# Patient Record
Sex: Female | Born: 2000 | Race: Black or African American | Hispanic: No | Marital: Single | Smoking: Never smoker
Health system: Southern US, Community
[De-identification: ages and names within clinical notes are randomized; demographics above are authoritative.]

## PROBLEM LIST (undated history)

## (undated) DIAGNOSIS — N189 Chronic kidney disease, unspecified: Secondary | ICD-10-CM

## (undated) DIAGNOSIS — I82409 Acute embolism and thrombosis of unspecified deep veins of unspecified lower extremity: Secondary | ICD-10-CM

## (undated) HISTORY — DX: Acute embolism and thrombosis of unspecified deep veins of unspecified lower extremity: I82.409

## (undated) HISTORY — DX: Chronic kidney disease, unspecified: N18.9

---
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
## 2020-04-03 ENCOUNTER — Other Ambulatory Visit: Payer: Self-pay | Admitting: Gastroenterology

## 2020-04-03 DIAGNOSIS — R1032 Left lower quadrant pain: Secondary | ICD-10-CM

## 2020-04-03 DIAGNOSIS — K5900 Constipation, unspecified: Secondary | ICD-10-CM

## 2020-04-18 ENCOUNTER — Other Ambulatory Visit: Payer: Medicaid Other

## 2020-04-26 ENCOUNTER — Other Ambulatory Visit: Payer: Medicaid Other

## 2020-05-01 ENCOUNTER — Ambulatory Visit
Admission: RE | Admit: 2020-05-01 | Discharge: 2020-05-01 | Disposition: A | Discharge status: Home / Self Care | Payer: 59 | Source: Ambulatory Visit | Attending: Gastroenterology | Admitting: Gastroenterology

## 2020-05-01 DIAGNOSIS — K5900 Constipation, unspecified: Secondary | ICD-10-CM

## 2020-05-01 DIAGNOSIS — R1032 Left lower quadrant pain: Secondary | ICD-10-CM

## 2020-05-01 MED ORDER — IOPAMIDOL (ISOVUE-300) INJECTION 61%
100.0000 mL | Freq: Once | INTRAVENOUS | Status: AC | PRN
  Administered 2020-05-01: 100 mL via INTRAVENOUS

## 2020-11-18 HISTORY — PX: RENAL BIOPSY: SHX156

## 2020-12-05 IMAGING — CT CT ABD-PELV W/ CM
2 of 4 series · 14 of 46 positions shown, 16 images · IV contrast (iopamidol)
Comparison: None.

CLINICAL DATA: Left lower quadrant pain and constipation for
approximately 1 year. Intermittent blood in stool.

EXAM:
CT ABDOMEN AND PELVIS WITH CONTRAST
TECHNIQUE: Multidetector CT imaging of the abdomen and pelvis was performed
using the standard protocol following bolus administration of
intravenous contrast.
CONTRAST:  100mL [id number] IOPAMIDOL (X7H7S8-066) INJECTION 61%

[Series 2: abd pelvis 5.00 br40 s3 axial · axial · 0.64mm/px · z∈[+1273,+1663]mm · 11 of 94 slices shown, 13 images]
[im 8/94  soft-tissue]
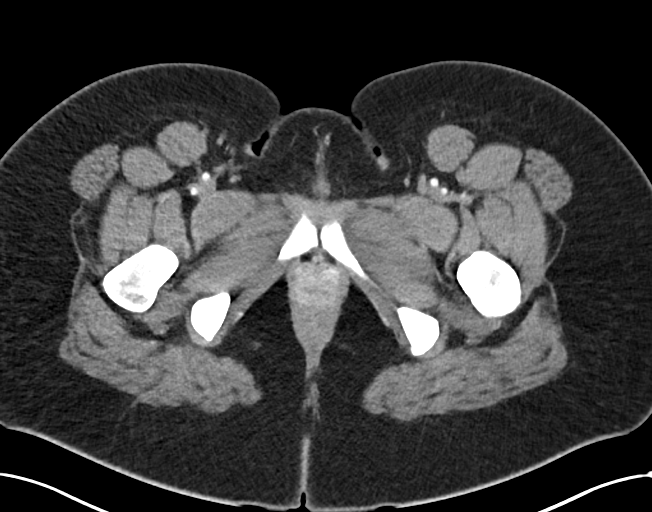
[im 8/94  bone]
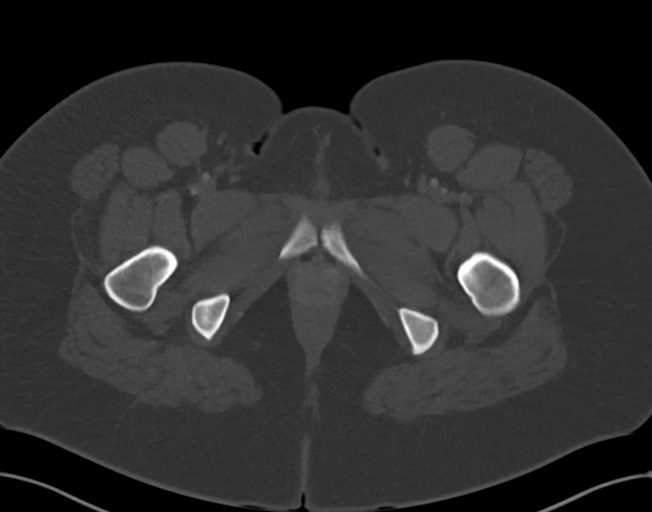
[im 16/94  soft-tissue]
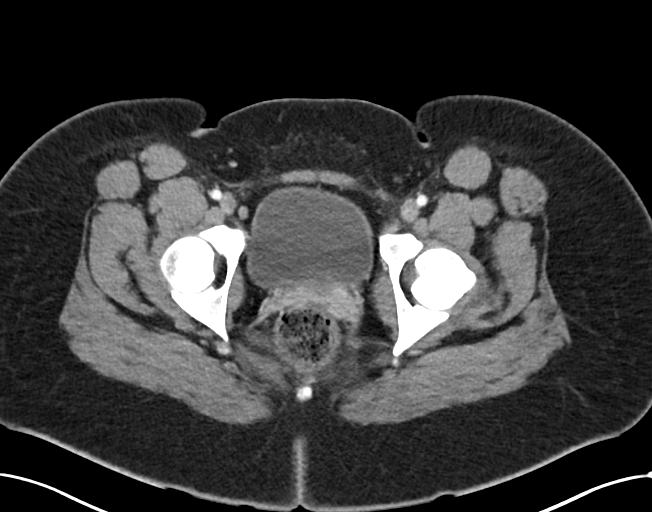
[im 24/94  soft-tissue]
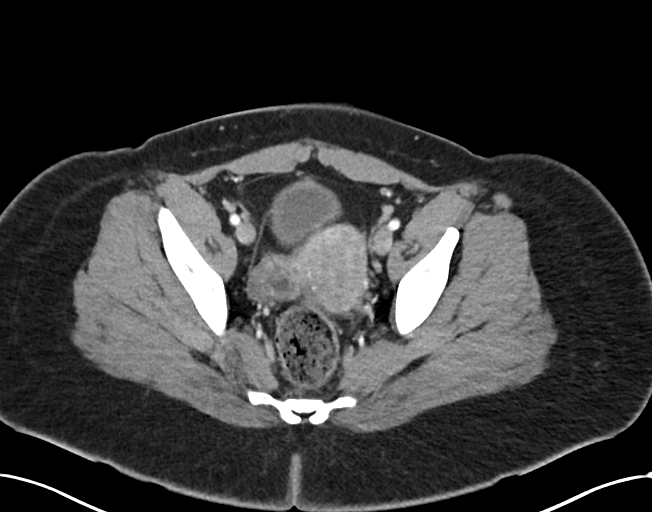
[im 32/94  soft-tissue]
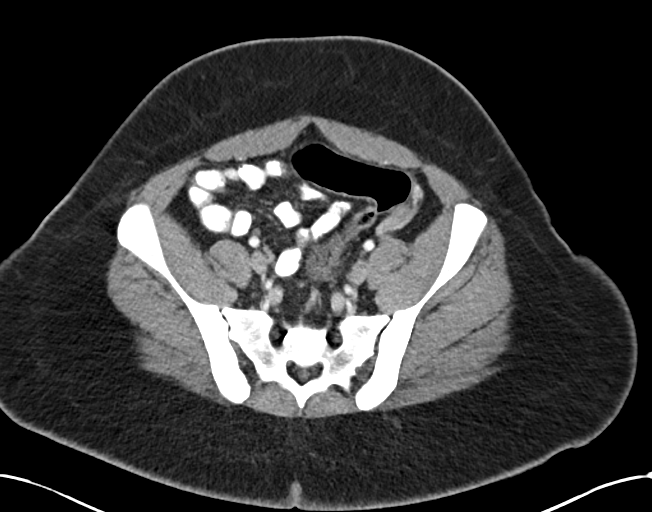
[im 39/94  soft-tissue]
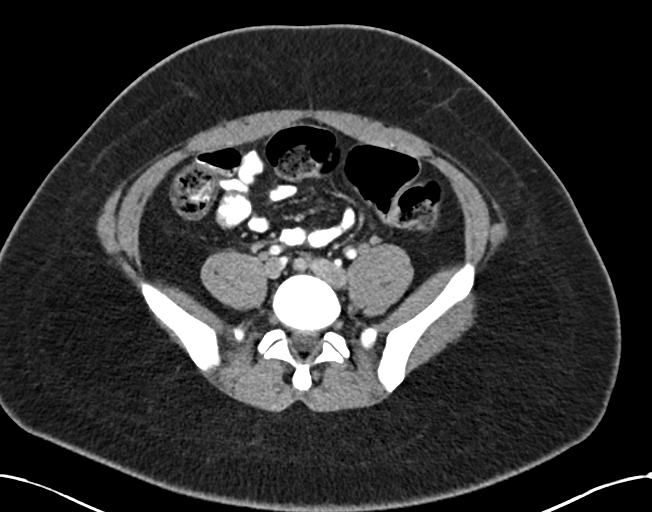
[im 47/94  soft-tissue]
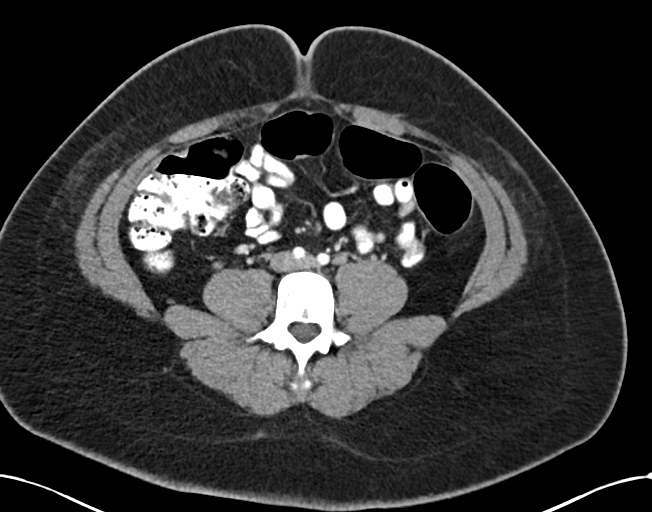
[im 55/94  soft-tissue]
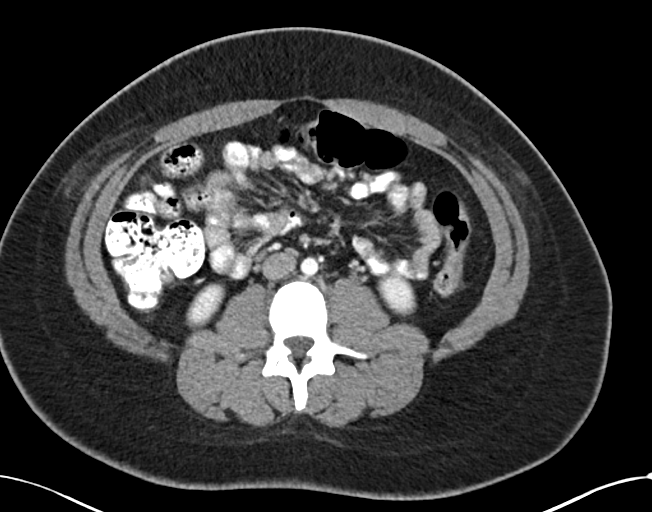
[im 63/94  soft-tissue]
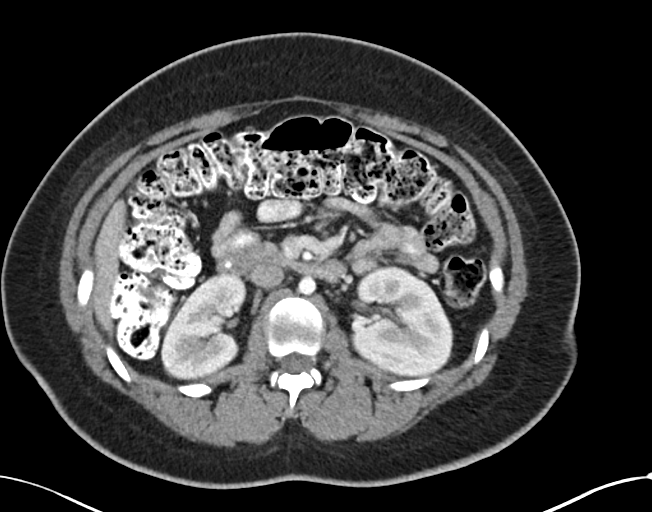
[im 70/94  soft-tissue]
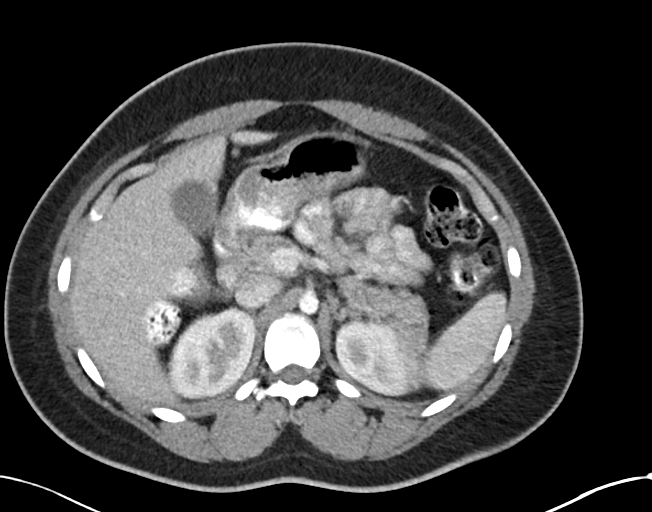
[im 70/94  bone]
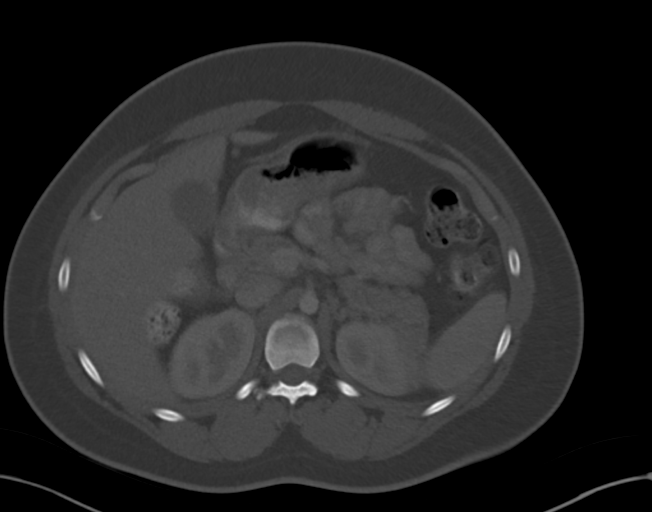
[im 78/94  soft-tissue]
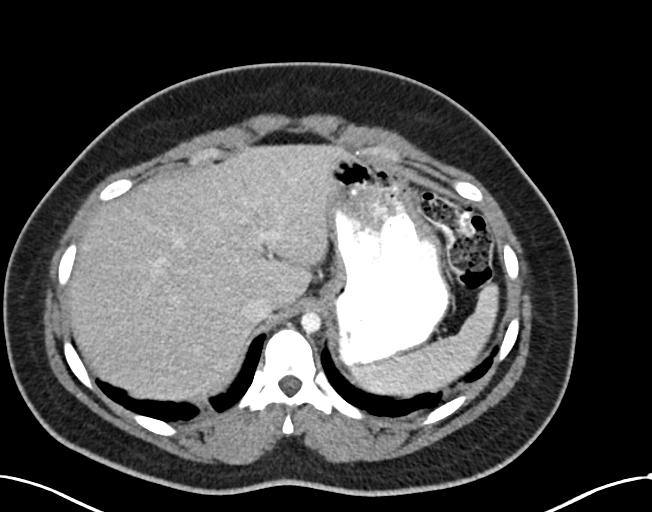
[im 86/94  soft-tissue]
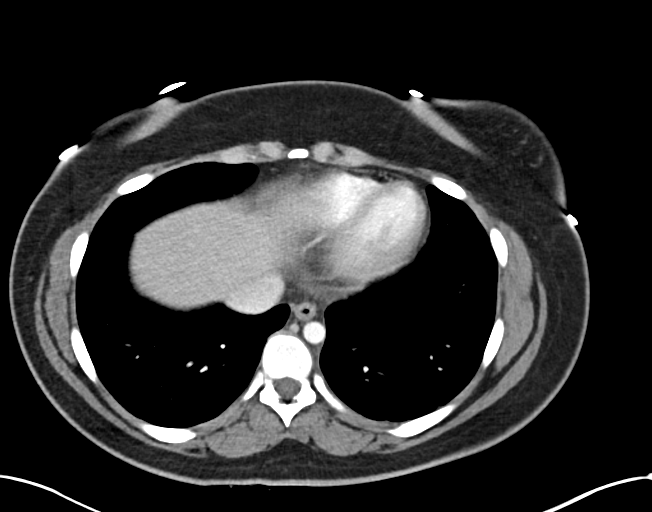

[Series 6: abd pelvis 2.00 br40 s3 cor · coronal · 0.81mm/px · 3 of 158 slices shown]
[im 53/158  soft-tissue]
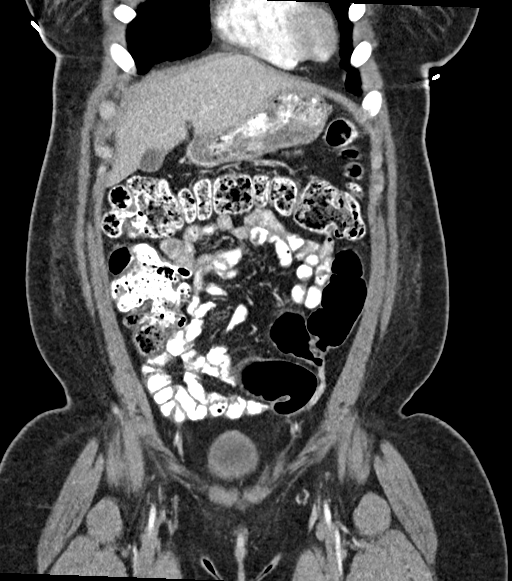
[im 70/158  soft-tissue]
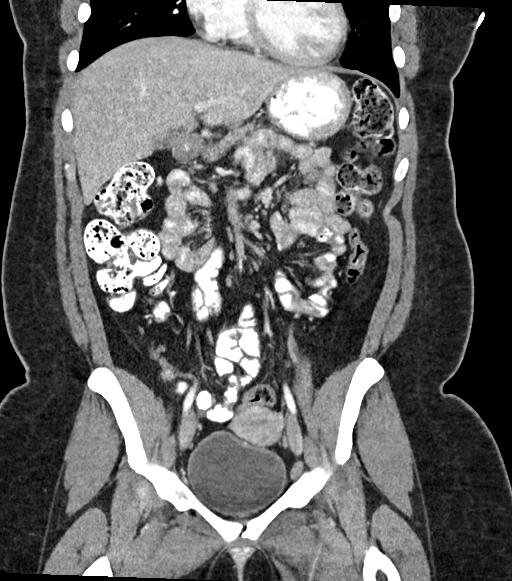
[im 88/158  soft-tissue]
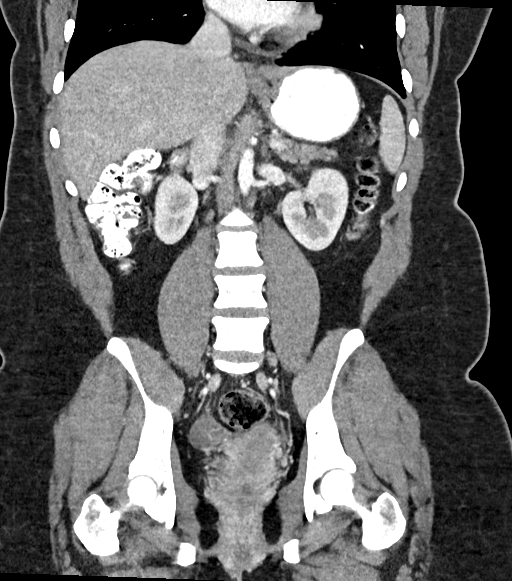

[14 of 46 positions shown; findings below may reference images not displayed]

FINDINGS: Lower Chest: No acute findings.

Hepatobiliary: No hepatic masses identified. Gallbladder is
unremarkable. No evidence of biliary ductal dilatation.

Pancreas:  No mass or inflammatory changes.

Spleen: Within normal limits in size and appearance.

Adrenals/Urinary Tract: No masses identified. No evidence of
ureteral calculi or hydronephrosis.

Stomach/Bowel: No evidence of obstruction, inflammatory process or
abnormal fluid collections. Normal appendix visualized.

Vascular/Lymphatic: No pathologically enlarged lymph nodes. No
abdominal aortic aneurysm.

Reproductive:  No mass or other significant abnormality.

Other:  None.

Musculoskeletal:  No suspicious bone lesions identified.
IMPRESSION: Negative. No acute findings or other significant abnormality.

## 2021-05-05 ENCOUNTER — Emergency Department (HOSPITAL_BASED_OUTPATIENT_CLINIC_OR_DEPARTMENT_OTHER): Payer: 59

## 2021-05-05 ENCOUNTER — Emergency Department (HOSPITAL_COMMUNITY)
Admission: EM | Admit: 2021-05-05 | Discharge: 2021-05-05 | Disposition: A | Discharge status: Home / Self Care | Payer: 59 | Attending: Emergency Medicine | Admitting: Emergency Medicine

## 2021-05-05 ENCOUNTER — Encounter (HOSPITAL_COMMUNITY): Payer: Self-pay

## 2021-05-05 ENCOUNTER — Other Ambulatory Visit: Payer: Self-pay

## 2021-05-05 DIAGNOSIS — I10 Essential (primary) hypertension: Secondary | ICD-10-CM | POA: Insufficient documentation

## 2021-05-05 DIAGNOSIS — I82431 Acute embolism and thrombosis of right popliteal vein: Secondary | ICD-10-CM | POA: Diagnosis not present

## 2021-05-05 DIAGNOSIS — M7989 Other specified soft tissue disorders: Secondary | ICD-10-CM

## 2021-05-05 DIAGNOSIS — M79661 Pain in right lower leg: Secondary | ICD-10-CM

## 2021-05-05 LAB — CBC WITH DIFFERENTIAL/PLATELET
Abs Immature Granulocytes: 0.02 10*3/uL (ref 0.00–0.07)
Basophils Absolute: 0.1 10*3/uL (ref 0.0–0.1)
Basophils Relative: 1 %
Eosinophils Absolute: 0.2 10*3/uL (ref 0.0–0.5)
Eosinophils Relative: 2 %
HCT: 41 % (ref 36.0–46.0)
Hemoglobin: 12.9 g/dL (ref 12.0–15.0)
Immature Granulocytes: 0 %
Lymphocytes Relative: 32 %
Lymphs Abs: 2.5 10*3/uL (ref 0.7–4.0)
MCH: 26.4 pg (ref 26.0–34.0)
MCHC: 31.5 g/dL (ref 30.0–36.0)
MCV: 84 fL (ref 80.0–100.0)
Monocytes Absolute: 0.5 10*3/uL (ref 0.1–1.0)
Monocytes Relative: 6 %
Neutro Abs: 4.5 10*3/uL (ref 1.7–7.7)
Neutrophils Relative %: 59 %
Platelets: 396 10*3/uL (ref 150–400)
RBC: 4.88 MIL/uL (ref 3.87–5.11)
RDW: 15.2 % (ref 11.5–15.5)
WBC: 7.7 10*3/uL (ref 4.0–10.5)
nRBC: 0 % (ref 0.0–0.2)

## 2021-05-05 LAB — BASIC METABOLIC PANEL
Anion gap: 8 (ref 5–15)
BUN: 10 mg/dL (ref 6–20)
CO2: 22 mmol/L (ref 22–32)
Calcium: 8.7 mg/dL — ABNORMAL LOW (ref 8.9–10.3)
Chloride: 110 mmol/L (ref 98–111)
Creatinine, Ser: 0.8 mg/dL (ref 0.44–1.00)
GFR, Estimated: >60 mL/min (ref >60)
Glucose, Bld: 90 mg/dL (ref 70–99)
Potassium: 4.1 mmol/L (ref 3.5–5.1)
Sodium: 140 mmol/L (ref 135–145)

## 2021-05-05 MED ORDER — OXYCODONE HCL 5 MG PO TABS
5.0000 mg | ORAL_TABLET | Freq: Four times a day (QID) | ORAL | 0 refills | Status: DC | PRN
Start: 2021-05-05 — End: 2021-12-28

## 2021-05-05 MED ORDER — APIXABAN (ELIQUIS) VTE STARTER PACK (10MG AND 5MG): ORAL_TABLET | ORAL | 0 refills | Status: DC

## 2021-05-05 NOTE — Discharge Instructions (Addendum)
Findings consistent with acute deep vein thrombosis involving the right popliteal vein, right posterior tibial veins, and right peroneal veins.

## 2021-05-05 NOTE — Progress Notes (Signed)
RLE venous duplex has been completed.  Preliminary findings given to Eustaquio Maize, [place].  Results can be found under chart review under CV PROC. 05/05/2021 12:59 PM Avey Mcmanamon RVT, RDMS

## 2021-05-05 NOTE — ED Provider Notes (Signed)
Emergency Medicine Provider Triage Evaluation Note  Molly Wise , a 20 y.o. female  was evaluated in triage.  Pt complains of gradual onset, constant, swelling/tightness in R calf for the past couple of weeks. Felt a "charlie horse" 5 days prior to the swelling in her sleep. Went to [facility] today and sent here for DVT rule out. NO chest pain or SOB. NO hx DVT/PE.   Review of Systems  Positive: + right calf swelling, pain Negative: - sob, chest pain  Physical Exam  BP (!) 149/84 (BP Location: Left Arm)   Pulse 76   Temp 98.4 F (36.9 C) (Oral)   Resp 18   SpO2 100%  Gen:   Awake, no distress   Resp:  Normal effort  MSK:   Moves extremities without difficulty  Other:  RLE swelling compared to LLE with TTP to calf. 2+ PT pulse. ROM intact.   Medical Decision Making  Medically screening exam initiated at 12:02 PM.  Appropriate orders placed.  Molly Wise was informed that the remainder of the evaluation will be completed by another provider, this initial triage assessment does not replace that evaluation, and the importance of remaining in the ED until their evaluation is complete.  DVT study ordered.    Eustaquio Maize, PA-C 05/05/21 1203    Arnaldo Natal, MD 05/06/21 1126

## 2021-05-05 NOTE — ED Notes (Signed)
ED Provider at bedside. 

## 2021-05-05 NOTE — ED Provider Notes (Signed)
[place] EMERGENCY DEPARTMENT Provider Note   CSN: [id number] Arrival date & time: 05/05/21  1122     History No chief complaint on file.   Molly Wise is a 20 y.o. female.  3 days ago, she was evaluated at an urgent care.  She followed up at orthopedics and was sent to the emergency department to rule out DVT.  She has no risk factors for venous thromboembolism.  No family history of the same.  No history of long trips, recent surgery.  She does not take oral contraceptives.  The history is provided by the patient.  Leg Pain Location:  Leg Time since incident:  2 weeks Injury: no   Leg location:  R lower leg Pain details:    Quality:  Cramping   Radiates to:  Does not radiate   Severity:  Moderate   Onset quality:  Gradual   Duration:  2 weeks   Timing:  Constant   Progression:  Worsening Chronicity:  New Prior injury to area:  No Relieved by:  Heat Worsened by:  Nothing Ineffective treatments:  NSAIDs Associated symptoms: swelling   Associated symptoms: no back pain, no decreased ROM, no fever, no numbness and no tingling       History reviewed. No pertinent past medical history.  Patient Active Problem List   Diagnosis Date Noted   Prediabetes 12/16/2017   Overweight in childhood with body mass index (BMI) of 85th to 94.9th percentile 12/16/2017   Essential hypertension, benign 12/16/2017   Acanthosis nigricans, acquired 12/16/2017   Dyspepsia 12/16/2017   Goiter 12/16/2017    No past surgical history on file.   OB History   No obstetric history on file.     Family History  Problem Relation Age of Onset   Hypertension Mother    Berenice Primas' disease Mother    Diabetes Father    Cancer Maternal Grandmother    Cancer Paternal Grandmother    Diabetes Paternal Grandfather    Hyperlipidemia Paternal Grandfather    Kidney disease Paternal Grandfather     Social History   Tobacco Use   Smoking status: Never   Smokeless  tobacco: Never    Home Medications Prior to Admission medications   Medication Sig Start Date End Date Taking? Authorizing Provider  APIXABAN (ELIQUIS) VTE STARTER PACK (10MG  AND 5MG ) Take as directed on package: start with two-5mg  tablets twice daily for 7 days. On day 8, switch to one-5mg  tablet twice daily. 05/05/21  Yes Arnaldo Natal, MD  Cholecalciferol (VITAMIN D-3) 25 MCG (1000 UT) CAPS Take 1,000-2,000 Units by mouth daily.   Yes [provider]  FEROSUL 325 (65 Fe) MG tablet Take 325 mg by mouth daily with breakfast. 03/28/21  Yes [provider]  Vitamin D, Ergocalciferol, (DRISDOL) 1.25 MG (50000 UNIT) CAPS capsule Take 50,000 Units by mouth every Sunday. 04/04/21  Yes [provider]  ranitidine (ZANTAC) 150 MG tablet Take 1 tablet (150 mg total) by mouth 2 (two) times daily. Patient not taking: No sig reported 12/15/17   Sherrlyn Hock, MD    Allergies    Patient has no known allergies.  Review of Systems   Review of Systems  Constitutional:  Negative for chills and fever.  HENT:  Negative for ear pain and sore throat.   Eyes:  Negative for pain and visual disturbance.  Respiratory:  Negative for cough and shortness of breath.   Cardiovascular:  Negative for chest pain and palpitations.  Gastrointestinal:  Negative for abdominal pain and vomiting.  Genitourinary:  Negative for dysuria and hematuria.  Musculoskeletal:  Negative for arthralgias and back pain.  Skin:  Negative for color change and rash.  Neurological:  Negative for seizures and syncope.  All other systems reviewed and are negative.  Physical Exam Updated Vital Signs BP 138/75 (BP Location: Right Arm)   Pulse 91   Temp 99 F (37.2 C) (Oral)   Resp 18   SpO2 99%   Physical Exam Vitals and nursing note reviewed.  HENT:     Head: Normocephalic and atraumatic.  Eyes:     General: No scleral icterus. Pulmonary:     Effort: Pulmonary effort is normal. No respiratory  distress.  Musculoskeletal:     Cervical back: Normal range of motion.     Comments: Right lower extremity is mildly swollen.  There is tenderness to palpation at the medial aspect of the lower leg and in the popliteal fossa.  The limb is warm and well-perfused with intact sensation.  Skin:    General: Skin is warm and dry.  Neurological:     General: No focal deficit present.     Mental Status: She is alert and oriented to person, place, and time.  Psychiatric:        Mood and Affect: Mood normal.    ED Results / Procedures / Treatments   Labs (all labs ordered are listed, but only abnormal results are displayed) Labs Reviewed  BASIC METABOLIC PANEL - Abnormal; Notable for the following components:      Result Value   Calcium 8.7 (*)    All other components within normal limits  CBC WITH DIFFERENTIAL/PLATELET    EKG None  Radiology VAS [place] LOWER EXTREMITY VENOUS (DVT) (ONLY [facility] & [facility] 7a-7p)  Result Date: 05/05/2021  Lower Venous DVT Study Patient Name:  Molly Wise  Date of Exam:   05/05/2021 Medical Rec #: [id number]           Accession #:    [id number] Date of Birth: 2001-10-13            Patient Gender: F Patient Age:   20Y Exam Location:  [facility] Procedure:      VAS [place] LOWER EXTREMITY VENOUS (DVT) Referring Phys: [id number] [place] [facility] --------------------------------------------------------------------------------  Indications: Right calf pain and swelling for 2 weeks.  Comparison Study: No previous exams Performing Technologist: Jody Hill RVT, RDMS  Examination Guidelines: A complete evaluation includes B-mode imaging, spectral Doppler, color Doppler, and power Doppler as needed of all accessible portions of each vessel. Bilateral testing is considered an integral part of a complete examination. Limited examinations for reoccurring indications may be performed as noted. The reflux portion of the exam is performed with the patient in reverse Trendelenburg.   +---------+---------------+---------+-----------+----------+--------------+ RIGHT    CompressibilityPhasicitySpontaneityPropertiesThrombus Aging +---------+---------------+---------+-----------+----------+--------------+ CFV      Full           Yes      Yes                                 +---------+---------------+---------+-----------+----------+--------------+ SFJ      Full                                                        +---------+---------------+---------+-----------+----------+--------------+  FV Prox  Full           Yes      Yes                                 +---------+---------------+---------+-----------+----------+--------------+ FV Mid   Full           Yes      Yes                                 +---------+---------------+---------+-----------+----------+--------------+ FV DistalFull           Yes      Yes                                 +---------+---------------+---------+-----------+----------+--------------+ PFV      Full                                                        +---------+---------------+---------+-----------+----------+--------------+ POP      None           No       No                   Acute          +---------+---------------+---------+-----------+----------+--------------+ PTV      None           No       No                   Acute          +---------+---------------+---------+-----------+----------+--------------+ PERO     None           No       No                   Acute          +---------+---------------+---------+-----------+----------+--------------+   +----+---------------+---------+-----------+----------+--------------+ LEFTCompressibilityPhasicitySpontaneityPropertiesThrombus Aging +----+---------------+---------+-----------+----------+--------------+ CFV Full           Yes      Yes                                  +----+---------------+---------+-----------+----------+--------------+     Summary: RIGHT: - Findings consistent with acute deep vein thrombosis involving the right popliteal vein, right posterior tibial veins, and right peroneal veins. - There is no evidence of superficial venous thrombosis.  - No cystic structure found in the popliteal fossa.  LEFT: - No evidence of common femoral vein obstruction.  *See table(s) above for measurements and observations.    Preliminary     Procedures Procedures   Medications Ordered in ED Medications - No data to display  ED Course  I have reviewed the triage vital signs and the nursing notes.  Pertinent labs & imaging results that were available during my care of the patient were reviewed by me and considered in my medical decision making (see chart for details).    MDM Rules/Calculators/A&P  Kathrene SHAKETTA RILL presents with signs and symptoms of a DVT of the right lower extremity.  Ultrasound consistent with this pathology.  Renal function normal.  She was started on Eliquis.  I had a conversation with her, and I also talked via phone to her mother and friend.  The patient will follow-up with hematology for consideration of genetic testing.  She understands she will be on anticoagulation for approximately 3 months.  We did talk about bleeding precautions.  We talked about return precautions.  Symptomatic management of pain and swelling was also discussed. Final Clinical Impression(s) / ED Diagnoses Final diagnoses:  Acute deep vein thrombosis (DVT) of popliteal vein of right lower extremity ([place])    Rx / DC Orders ED Discharge Orders          Ordered    APIXABAN (ELIQUIS) VTE STARTER PACK (10MG  AND 5MG )        05/05/21 1707             Arnaldo Natal, MD 05/05/21 1715

## 2021-05-05 NOTE — ED Triage Notes (Signed)
Patient complains of right calf pain x 2 weeks. Sent from ortho for possible DVT. No hx of same, no travel, no SOB

## 2021-05-11 ENCOUNTER — Other Ambulatory Visit: Payer: Self-pay | Admitting: Nephrology

## 2021-05-11 DIAGNOSIS — R809 Proteinuria, unspecified: Secondary | ICD-10-CM

## 2021-05-17 ENCOUNTER — Telehealth: Payer: Self-pay | Admitting: Hematology and Oncology

## 2021-05-17 NOTE — Telephone Encounter (Signed)
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 Ms Molly Wise has been cld and schedule to see Dr. Chryl Heck on 7/18 at 10am. Pt aware to arrive 20 minutes early. She preferred a later date.

## 2021-05-18 ENCOUNTER — Other Ambulatory Visit: Payer: 59

## 2021-06-01 ENCOUNTER — Other Ambulatory Visit: Payer: 59

## 2021-06-04 ENCOUNTER — Other Ambulatory Visit: Payer: Self-pay

## 2021-06-04 ENCOUNTER — Encounter: Payer: Self-pay | Admitting: Hematology and Oncology

## 2021-06-04 ENCOUNTER — Inpatient Hospital Stay: Payer: 59

## 2021-06-04 ENCOUNTER — Inpatient Hospital Stay: Payer: 59 | Attending: Hematology and Oncology | Admitting: Hematology and Oncology

## 2021-06-04 VITALS — BP 134/69 | HR 62 | Temp 98.9°F | Resp 18 | Ht 65.0 in | Wt 234.2 lb

## 2021-06-04 DIAGNOSIS — I82431 Acute embolism and thrombosis of right popliteal vein: Secondary | ICD-10-CM | POA: Insufficient documentation

## 2021-06-04 DIAGNOSIS — Z79899 Other long term (current) drug therapy: Secondary | ICD-10-CM | POA: Insufficient documentation

## 2021-06-04 DIAGNOSIS — Z7901 Long term (current) use of anticoagulants: Secondary | ICD-10-CM | POA: Diagnosis not present

## 2021-06-04 DIAGNOSIS — N028 Recurrent and persistent hematuria with other morphologic changes: Secondary | ICD-10-CM | POA: Insufficient documentation

## 2021-06-04 LAB — ANTITHROMBIN III: AntiThromb III Func: 139 % — ABNORMAL HIGH (ref 75–120)

## 2021-06-04 NOTE — Progress Notes (Signed)
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  Patient arrived to the appointment today with her mom.  She is doing well today, denies any right lower extremity pain, worsening shortness of breath or chest pain.  She does feel tired in her hands and her legs but no visible swelling today. She is taking all her medications as prescribed.  She has a nephrology follow-up for the IgA glomerulonephritis. No prior history of DVT/PE.  According to mom no family history of clotting disorders. She is not on any birth control.  No recent COVID-19 infection. I have reviewed available hypercoagulable work-up in her chart, she tested negative for factor V Leiden mutation, prothrombin gene mutation, lupus anticoagulant negative, negative beta-2 glycoprotein antibodies, mildly elevated anticardiolipin IgM antibodies and PNH flow was negative.  I have recommended that we proceed with rest of the hypercoagulable work-up however I am pretty certain that nephrotic range proteinuria and IgA glomerulonephritis may have created a hypercoagulable state due to an imbalance between naturally occurring procoagulants and anticoagulants. I have recommended that she continue blood thinners for at least 67months, return to clinic in about 3 months for follow-up recommendations. Mom and patient agreed and expressed understanding of the recommendations. Thank you for consulting [place] in the care of this patient.  Please not hesitate to contact us with any additional questions or concerns.  HISTORY OF PRESENTING ILLNESS:   Molly Wise 20  y.o. female is here because of popliteal vein DVT  This is a very pleasant 20 year old female patient with no significant past medical history, recently diagnosed with IgA glomerulonephritis and currently on prednisone, also was found to have right lower extremity DVT and pulmonary embolism referred to hematology for anticoagulation recommendations.  Molly Wise is here for an initial visit with her mom.  She has been taking anticoagulation as recommended, currently on Eliquis 5 mg p.o. twice a day and tolerating it well.  Her right lower extremity pain has almost resolved.  She does feel some shortness of breath when she walks a bit but overall she does not have any severe chest pain, chest tightness or shortness of breath with exertion.  She has never had any prior episodes of DVT/PE.  No concurrent use of oral contraceptives or recent COVID-19 infection.  She actually denies any recent infections including upper respiratory tract infections or gastrointestinal infections. She will be following up with a nephrologist on, currently on prednisone 60 mg daily. No change in bowel habits or urinary habits. Rest of the pertinent 10 point ROS reviewed and negative.  REVIEW OF SYSTEMS:    Constitutional: Denies fevers, chills or abnormal night sweats Eyes: Denies blurriness of vision, double vision or watery eyes Ears, nose, mouth, throat, and face: Denies mucositis or sore throat Respiratory: Denies cough, dyspnea or wheezes Cardiovascular: Denies palpitation, chest discomfort or lower extremity swelling Gastrointestinal:  Denies nausea, heartburn or change in bowel habits Skin: Denies abnormal skin rashes Lymphatics: Denies new lymphadenopathy or easy bruising Neurological:Denies numbness, tingling or new weaknesses Behavioral/Psych: Mood is stable, no new changes  All other systems were reviewed with the patient and are negative.  MEDICAL HISTORY:  No past medical  history on file.  SURGICAL  HISTORY: No past surgical history on file.  SOCIAL HISTORY: Social History   Socioeconomic History   Marital status: Single    Spouse name: Not on file   Number of children: Not on file   Years of education: Not on file   Highest education level: Not on file  Occupational History   Not on file  Tobacco Use   Smoking status: Never   Smokeless tobacco: Never  Substance and Sexual Activity   Alcohol use: Not on file   Drug use: Not on file   Sexual activity: Not on file  Other Topics Concern   Not on file  Social History Narrative   Is in 11th grade at [employer]   Social Determinants of Health   Financial Resource Strain: Not on file  Food Insecurity: Not on file  Transportation Needs: Not on file  Physical Activity: Not on file  Stress: Not on file  Social Connections: Not on file  Intimate Partner Violence: Not on file    FAMILY HISTORY: Family History  Problem Relation Age of Onset   Hypertension Mother    Berenice Primas' disease Mother    Diabetes Father    Cancer Maternal Grandmother    Cancer Paternal Grandmother    Diabetes Paternal Grandfather    Hyperlipidemia Paternal Grandfather    Kidney disease Paternal Grandfather     ALLERGIES:  has No Known Allergies.  MEDICATIONS:  Current Outpatient Medications  Medication Sig Dispense Refill   APIXABAN (ELIQUIS) VTE STARTER PACK (10MG  AND 5MG ) Take as directed on package: start with two-5mg  tablets twice daily for 7 days. On day 8, switch to one-5mg  tablet twice daily. 1 each 0   FEROSUL 325 (65 Fe) MG tablet Take 325 mg by mouth daily with breakfast.     Cholecalciferol (VITAMIN D-3) 25 MCG (1000 UT) CAPS Take 1,000-2,000 Units by mouth daily. (Patient not taking: Reported on 06/04/2021)     omeprazole (PRILOSEC) 20 MG capsule Take 20 mg by mouth daily.     oxyCODONE (ROXICODONE) 5 MG immediate release tablet Take 1 tablet (5 mg total) by mouth every 6 (six) hours as needed for up to 6 doses for severe pain. (Patient  not taking: Reported on 06/04/2021) 6 tablet 0   predniSONE (DELTASONE) 20 MG tablet Take 60 mg by mouth daily.     ranitidine (ZANTAC) 150 MG tablet Take 1 tablet (150 mg total) by mouth 2 (two) times daily. (Patient not taking: No sig reported) 60 tablet 6   sulfamethoxazole-trimethoprim (BACTRIM) 400-80 MG tablet Take 1 tablet by mouth daily.     Vitamin D, Ergocalciferol, (DRISDOL) 1.25 MG (50000 UNIT) CAPS capsule Take 50,000 Units by mouth every Sunday.     No current facility-administered medications for this visit.    PHYSICAL EXAMINATION: ECOG PERFORMANCE STATUS: 0  Vitals:   06/04/21 1025  BP: 134/69  Pulse: 62  Resp: 18  Temp: 98.9 F (37.2 C)  SpO2: 100%   Filed Weights   06/04/21 1025  Weight: 234 lb 3.2 oz (106.2 kg)    GENERAL:alert, no distress and comfortable SKIN: skin color, texture, turgor are normal, no rashes or significant lesions EYES: normal, conjunctiva are pink and non-injected, sclera clear OROPHARYNX:no exudate, no erythema and lips, buccal mucosa, and tongue normal  NECK: supple, thyroid normal size, non-tender, without nodularity LYMPH:  no palpable lymphadenopathy in the cervical, axillary or inguinal LUNGS: clear to auscultation and percussion with normal breathing effort HEART:  regular rate & rhythm and no murmurs and no lower extremity edema ABDOMEN:abdomen soft, non-tender and normal bowel sounds Musculoskeletal:no cyanosis of digits and no clubbing  PSYCH: alert & oriented x 3 with fluent speech NEURO: no focal motor/sensory deficits  LABORATORY DATA:  I have reviewed the data as listed Lab Results  Component Value Date   WBC 7.7 05/05/2021   HGB 12.9 05/05/2021   HCT 41.0 05/05/2021   MCV 84.0 05/05/2021   PLT 396 05/05/2021     Chemistry      Component Value Date/Time   NA 140 05/05/2021 1251   K 4.1 05/05/2021 1251   CL 110 05/05/2021 1251   CO2 22 05/05/2021 1251   BUN 10 05/05/2021 1251   CREATININE 0.80 05/05/2021 1251       Component Value Date/Time   CALCIUM 8.7 (L) 05/05/2021 1251       RADIOGRAPHIC STUDIES: I have personally reviewed the radiological images as listed and agreed with the findings in the report. VAS [place] LOWER EXTREMITY VENOUS (DVT) (ONLY [facility] & [facility] 7a-7p)  Result Date: 05/05/2021  Lower Venous DVT Study Patient Name:  LYNESHA BANGO  Date of Exam:   05/05/2021 Medical Rec #: [id number]           Accession #:    [id number] Date of Birth: [birth year]            Patient Gender: F Patient Age:   20Y Exam Location:  [facility] Procedure:      VAS [place] LOWER EXTREMITY VENOUS (DVT) Referring Phys: [id number] [place] [facility] --------------------------------------------------------------------------------  Indications: Right calf pain and swelling for 2 weeks.  Comparison Study: No previous exams Performing Technologist: Jody Hill RVT, RDMS  Examination Guidelines: A complete evaluation includes B-mode imaging, spectral Doppler, color Doppler, and power Doppler as needed of all accessible portions of each vessel. Bilateral testing is considered an integral part of a complete examination. Limited examinations for reoccurring indications may be performed as noted. The reflux portion of the exam is performed with the patient in reverse Trendelenburg.  +---------+---------------+---------+-----------+----------+--------------+ RIGHT    CompressibilityPhasicitySpontaneityPropertiesThrombus Aging +---------+---------------+---------+-----------+----------+--------------+ CFV      Full           Yes      Yes                                 +---------+---------------+---------+-----------+----------+--------------+ SFJ      Full                                                        +---------+---------------+---------+-----------+----------+--------------+ FV Prox  Full           Yes      Yes                                  +---------+---------------+---------+-----------+----------+--------------+ FV Mid   Full           Yes      Yes                                 +---------+---------------+---------+-----------+----------+--------------+ FV DistalFull  Yes      Yes                                 +---------+---------------+---------+-----------+----------+--------------+ PFV      Full                                                        +---------+---------------+---------+-----------+----------+--------------+ POP      None           No       No                   Acute          +---------+---------------+---------+-----------+----------+--------------+ PTV      None           No       No                   Acute          +---------+---------------+---------+-----------+----------+--------------+ PERO     None           No       No                   Acute          +---------+---------------+---------+-----------+----------+--------------+   +----+---------------+---------+-----------+----------+--------------+ LEFTCompressibilityPhasicitySpontaneityPropertiesThrombus Aging +----+---------------+---------+-----------+----------+--------------+ CFV Full           Yes      Yes                                 +----+---------------+---------+-----------+----------+--------------+     Summary: RIGHT: - Findings consistent with acute deep vein thrombosis involving the right popliteal vein, right posterior tibial veins, and right peroneal veins. - There is no evidence of superficial venous thrombosis.  - No cystic structure found in the popliteal fossa.  LEFT: - No evidence of common femoral vein obstruction.  *See table(s) above for measurements and observations. Electronically signed by Harold Barban MD on 05/05/2021 at 11:36:17 PM.    Final     Summary:  RIGHT:  - Findings consistent with acute deep vein thrombosis involving the right  popliteal vein, right posterior  tibial veins, and right peroneal veins.  - There is no evidence of superficial venous thrombosis.     - No cystic structure found in the popliteal fossa.     LEFT:  - No evidence of common femoral vein obstruction.   Factor V leiden Neg Prothrombin gene neg Flow for PNH neg LA neg Beta 2 GP antibodies neg Mildly elevated Ig M ACL ab Ferritin of 32.   All questions were answered. The patient knows to call the clinic with any problems, questions or concerns. I spent 45 minutes in the care of this patient including H and P, review of records, counseling and coordination of care.     Benay Pike, MD 06/04/2021 10:49 AM

## 2021-06-05 LAB — CARDIOLIPIN ANTIBODIES, IGG, IGM, IGA
Anticardiolipin IgA: <9 APL U/mL (ref 0–11)
Anticardiolipin IgG: <9 GPL U/mL (ref 0–14)
Anticardiolipin IgM: 13 MPL U/mL — ABNORMAL HIGH (ref 0–12)

## 2021-06-07 LAB — PROTEIN S ACTIVITY: Protein S Activity: 97 % (ref 63–140)

## 2021-06-07 LAB — PROTEIN C ACTIVITY: Protein C Activity: 131 % (ref 73–180)

## 2021-07-12 ENCOUNTER — Other Ambulatory Visit: Payer: Self-pay | Admitting: Hematology and Oncology

## 2021-07-12 ENCOUNTER — Other Ambulatory Visit: Payer: Self-pay

## 2021-07-12 ENCOUNTER — Telehealth: Payer: Self-pay

## 2021-07-12 MED ORDER — APIXABAN 5 MG PO TABS: 5.0000 mg | ORAL_TABLET | Freq: Two times a day (BID) | ORAL | 2 refills | Status: DC

## 2021-07-12 NOTE — Telephone Encounter (Signed)
Refilled eliquis prescription per patient's request. Called and reminded patient that, per Dr Chryl Heck, she should not get pregnant while taking this medication. Dr Chryl Heck strongly recommends birth control. Patient verbalized understanding.

## 2021-07-13 ENCOUNTER — Telehealth: Payer: Self-pay

## 2021-07-13 ENCOUNTER — Encounter: Payer: Self-pay | Admitting: Hematology and Oncology

## 2021-07-13 NOTE — Telephone Encounter (Signed)
Received MyChart message from patient stating she was having intermittent chest pain. Informed Dr Chryl Heck. Called patient and told her that we recommend she goes to the ED to be seen. Patient verbalized understanding.

## 2021-08-20 ENCOUNTER — Encounter: Payer: Self-pay | Admitting: Hematology and Oncology

## 2021-08-20 ENCOUNTER — Other Ambulatory Visit: Payer: Self-pay

## 2021-08-20 ENCOUNTER — Inpatient Hospital Stay
Payer: No Typology Code available for payment source | Attending: Hematology and Oncology | Admitting: Hematology and Oncology

## 2021-08-20 VITALS — BP 155/101 | HR 67 | Temp 97.6°F | Resp 16 | Ht 65.0 in | Wt 230.6 lb

## 2021-08-20 DIAGNOSIS — I82431 Acute embolism and thrombosis of right popliteal vein: Secondary | ICD-10-CM | POA: Diagnosis present

## 2021-08-20 DIAGNOSIS — D75839 Thrombocytosis, unspecified: Secondary | ICD-10-CM

## 2021-08-20 DIAGNOSIS — D509 Iron deficiency anemia, unspecified: Secondary | ICD-10-CM

## 2021-08-20 DIAGNOSIS — Z7901 Long term (current) use of anticoagulants: Secondary | ICD-10-CM | POA: Insufficient documentation

## 2021-08-20 NOTE — Progress Notes (Signed)
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   Initial hypercoagulable work-up in her chart, she tested negative for factor V Leiden mutation, prothrombin gene mutation, lupus anticoagulant negative, negative beta-2 glycoprotein antibodies, mildly elevated anticardiolipin IgM antibodies and PNH flow was negative.   Labs from 7/18, with mildly elevated anticardiolipin antibodies, normal protein C activity, normal protein S activity, mildly elevated AT III. We have discussed that nephrotic range proteinuria and IgA glomerulonephritis may have created a hypercoagulable state due to an imbalance between naturally occurring procoagulants and anticoagulants. She is here for FU, doing well overall.  ROS pertinent for some cramps in leg, intermittent chest pain, muscle aches. I dont believe most of her symptoms are associated to Eliquis. As long as her nephrotic syndrome is not in remission, she may have to continue anticoagulation. She will atleast continue it for 3 months and return. She understands the risk of blood thinners on board  2. Iron deficiency, ferritin of 20, recommend continuing ferrous sulfate 65 mg BID Thrombocytosis is intermittent and likely related to iron deficiency. We will monitor this, will repeat CBC in 3 months.  3. Lower extremity cramps. Encourage hydration and monitoring electrolytes.  4. High LDL on review of labs today, discussed her about the importance of talking to her PCP about the very high LDL  5. Chest pain, exertional, intermittent, encouraged to go to the ED if intractable  and also consider work up with PCP for cardiac etiology. She expressed understanding  6. Thrombocytosis, likely reactive, will monitor.  HISTORY OF PRESENTING ILLNESS:   Molly Wise 20 y.o. female is here because of popliteal vein DVT  This is a very pleasant 20 year old female patient with no significant past medical history, recently diagnosed with IgA glomerulonephritis and currently on prednisone, also was found to have right lower extremity DVT and pulmonary embolism referred to hematology for anticoagulation recommendations. During her initial visit, hypercoagulable work up was neg, mildly elevated aCL antibodies and antithrombin activity.  She is here for FU.  She is continuing on Eliquis.  She is following up with nephrology and currently is on prednisone 40 mg p.o. daily for nephrotic syndrome. Since last visit, she has noticed that her muscles ache from time, she has noticed more charley horses especially when she lays down at night, intermittent chest pain.  Last in August when she called [place] with intermittent chest pain, we recommended ER visit and patient tells me that they thought the chest pain was likely related to stress and could not find.  No bleeding complaints.  She is taking iron supplementation every other day. Rest of the pertinent 10 point ROS reviewed and negative.  MEDICAL HISTORY:  No past medical history on file.  SURGICAL HISTORY: No past surgical history on file.  SOCIAL HISTORY: Social History   Socioeconomic History   Marital status: Single    Spouse name: Not on file   Number of children: Not on file   Years of education: Not on file   Highest education level: Not on file  Occupational History   Not on file  Tobacco Use   Smoking status: Never   Smokeless tobacco: Never  Substance and Sexual Activity   Alcohol use: Not on file   Drug use: Not on file   Sexual activity: Not on file  Other Topics Concern   Not on file  Social History  Narrative   Is in 11th grade at [employer]   Social Determinants of Health   Financial Resource Strain: Not on file  Food Insecurity: Not on file  Transportation Needs: Not on file  Physical Activity: Not on file  Stress: Not on file  Social Connections: Not on file  Intimate Partner Violence: Not on file    FAMILY HISTORY: Family History  Problem Relation Age of Onset   Hypertension Mother    Berenice Primas' disease Mother    Diabetes Father    Cancer Maternal Grandmother    Cancer Paternal Grandmother    Diabetes Paternal Grandfather    Hyperlipidemia Paternal Grandfather    Kidney disease Paternal Grandfather     ALLERGIES:  has No Known Allergies.  MEDICATIONS:  Current Outpatient Medications  Medication Sig Dispense Refill   apixaban (ELIQUIS) 5 MG TABS tablet Take 1 tablet (5 mg total) by mouth 2 (two) times daily. 60 tablet 2   dapagliflozin propanediol (FARXIGA) 10 MG TABS tablet Take 10 mg by mouth daily.     FEROSUL 325 (65 Fe) MG tablet Take 325 mg by mouth daily with breakfast.     losartan (COZAAR) 25 MG tablet Take 12.5 mg by mouth daily.     omeprazole (PRILOSEC) 20 MG capsule Take 20 mg by mouth daily.     predniSONE (DELTASONE) 20 MG tablet Take 60 mg by mouth daily.     sulfamethoxazole-trimethoprim (BACTRIM) 400-80 MG tablet Take 1 tablet by mouth daily.     Vitamin D, Ergocalciferol, (DRISDOL) 1.25 MG (50000 UNIT) CAPS capsule Take 50,000 Units by mouth every Sunday.     Cholecalciferol (VITAMIN D-3) 25 MCG (1000 UT) CAPS Take 1,000-2,000 Units by mouth daily. (Patient not taking: Reported on 08/20/2021)     oxyCODONE (ROXICODONE) 5 MG immediate release tablet Take 1 tablet (5 mg total) by mouth every 6 (six) hours as needed for up to 6 doses for severe pain. (Patient not taking: Reported on 08/20/2021) 6 tablet 0   ranitidine (ZANTAC) 150 MG tablet Take 1 tablet (150 mg total) by mouth 2 (two) times daily. (Patient not taking: Reported on 08/20/2021) 60 tablet 6    No current facility-administered medications for this visit.    PHYSICAL EXAMINATION: ECOG PERFORMANCE STATUS: 0  Vitals:   08/20/21 0923  BP: (!) 155/101  Pulse: 67  Resp: 16  Temp: 97.6 F (36.4 C)  SpO2: 100%    Filed Weights   08/20/21 0923  Weight: 230 lb 9.6 oz (104.6 kg)   GENERAL:alert, no distress and comfortable SKIN: skin color, texture, turgor are normal, no rashes or significant lesions EYES: normal, conjunctiva are pink and non-injected, sclera clear OROPHARYNX:no exudate, no erythema and lips, buccal mucosa, and tongue normal  NECK: supple, thyroid normal size, non-tender, without nodularity LYMPH:  no palpable lymphadenopathy in the cervical, axillary  LUNGS: clear to auscultation and percussion with normal breathing effort HEART: regular rate & rhythm and no murmurs and no lower extremity edema ABDOMEN:abdomen soft, non-tender and normal bowel sounds Musculoskeletal:no cyanosis of digits and no clubbing  PSYCH: alert & oriented x 3 with fluent speech NEURO: no focal motor/sensory deficits  LABORATORY DATA:  I have reviewed the data as listed Lab Results  Component Value Date   WBC  7.7 05/05/2021   HGB 12.9 05/05/2021   HCT 41.0 05/05/2021   MCV 84.0 05/05/2021   PLT 396 05/05/2021     Chemistry      Component Value Date/Time   NA 140 05/05/2021 1251   K 4.1 05/05/2021 1251   CL 110 05/05/2021 1251   CO2 22 05/05/2021 1251   BUN 10 05/05/2021 1251   CREATININE 0.80 05/05/2021 1251      Component Value Date/Time   CALCIUM 8.7 (L) 05/05/2021 1251       RADIOGRAPHIC STUDIES: I have personally reviewed the radiological images as listed and agreed with the findings in the report. No results found.  Summary:  RIGHT:  - Findings consistent with acute deep vein thrombosis involving the right  popliteal vein, right posterior tibial veins, and right peroneal veins.  - There is no evidence of superficial venous thrombosis.     - No cystic  structure found in the popliteal fossa.     LEFT:  - No evidence of common femoral vein obstruction.   Factor V leiden Neg Prothrombin gene neg Flow for PNH neg LA neg Beta 2 GP antibodies neg Mildly elevated Ig M ACL ab Ferritin of 32. Normal protein C and S activity Mildly elevated AT III activity  Outside labs reviewed, ferritin of 20, and platelet count of 490, LDL 248  All questions were answered. The patient knows to call the clinic with any problems, questions or concerns. I spent 30 minutes in the care of this patient including H and P, review of records, counseling and coordination of care. Please review above-mentioned assessment and plan for detailed discussion from today.    Benay Pike, MD 08/20/2021 9:45 AM

## 2021-10-02 ENCOUNTER — Other Ambulatory Visit: Payer: Self-pay | Admitting: *Deleted

## 2021-10-02 ENCOUNTER — Ambulatory Visit (INDEPENDENT_AMBULATORY_CARE_PROVIDER_SITE_OTHER): Payer: No Typology Code available for payment source

## 2021-10-02 DIAGNOSIS — R000 Tachycardia, unspecified: Secondary | ICD-10-CM

## 2021-10-02 NOTE — Progress Notes (Unsigned)
Patient enrolled for [employer] to mail a 14 day ZIO XT monitor to her address on file.

## 2021-10-08 ENCOUNTER — Encounter: Payer: Self-pay | Admitting: Hematology and Oncology

## 2021-10-13 DIAGNOSIS — R000 Tachycardia, unspecified: Secondary | ICD-10-CM | POA: Diagnosis not present

## 2021-10-15 ENCOUNTER — Other Ambulatory Visit: Payer: Self-pay | Admitting: Nurse Practitioner

## 2021-10-15 MED ORDER — APIXABAN 5 MG PO TABS
5.0000 mg | ORAL_TABLET | Freq: Two times a day (BID) | ORAL | 0 refills | Status: DC
Start: 2021-10-15 — End: 2021-11-21

## 2021-10-17 ENCOUNTER — Telehealth: Payer: Self-pay | Admitting: Hematology and Oncology

## 2021-10-17 NOTE — Telephone Encounter (Signed)
Scheduled per sch msg. Called and left msg  

## 2021-11-19 ENCOUNTER — Other Ambulatory Visit: Payer: Self-pay | Admitting: Nurse Practitioner

## 2021-11-21 ENCOUNTER — Other Ambulatory Visit: Payer: Self-pay

## 2021-11-21 DIAGNOSIS — D509 Iron deficiency anemia, unspecified: Secondary | ICD-10-CM

## 2021-11-22 ENCOUNTER — Inpatient Hospital Stay
Payer: No Typology Code available for payment source | Attending: Hematology and Oncology | Admitting: Hematology and Oncology

## 2021-11-22 ENCOUNTER — Other Ambulatory Visit: Payer: Self-pay

## 2021-11-22 ENCOUNTER — Inpatient Hospital Stay: Payer: No Typology Code available for payment source

## 2021-11-22 VITALS — BP 130/74 | HR 85 | Temp 97.9°F | Resp 16 | Ht 65.0 in | Wt 235.8 lb

## 2021-11-22 DIAGNOSIS — Z86718 Personal history of other venous thrombosis and embolism: Secondary | ICD-10-CM | POA: Diagnosis present

## 2021-11-22 DIAGNOSIS — I82431 Acute embolism and thrombosis of right popliteal vein: Secondary | ICD-10-CM | POA: Diagnosis not present

## 2021-11-22 DIAGNOSIS — D75839 Thrombocytosis, unspecified: Secondary | ICD-10-CM | POA: Insufficient documentation

## 2021-11-22 DIAGNOSIS — E611 Iron deficiency: Secondary | ICD-10-CM | POA: Insufficient documentation

## 2021-11-22 DIAGNOSIS — Z7952 Long term (current) use of systemic steroids: Secondary | ICD-10-CM | POA: Insufficient documentation

## 2021-11-22 DIAGNOSIS — Z7964 Long term (current) use of myelosuppressive agent: Secondary | ICD-10-CM | POA: Insufficient documentation

## 2021-11-22 DIAGNOSIS — D509 Iron deficiency anemia, unspecified: Secondary | ICD-10-CM | POA: Diagnosis not present

## 2021-11-22 DIAGNOSIS — Z79899 Other long term (current) drug therapy: Secondary | ICD-10-CM | POA: Insufficient documentation

## 2021-11-22 DIAGNOSIS — E785 Hyperlipidemia, unspecified: Secondary | ICD-10-CM | POA: Insufficient documentation

## 2021-11-22 LAB — CMP (CANCER CENTER ONLY)
ALT: 7 U/L (ref 0–44)
AST: 11 U/L — ABNORMAL LOW (ref 15–41)
Albumin: 3.3 g/dL — ABNORMAL LOW (ref 3.5–5.0)
Alkaline Phosphatase: 63 U/L (ref 38–126)
Anion gap: 7 (ref 5–15)
BUN: 12 mg/dL (ref 6–20)
CO2: 22 mmol/L (ref 22–32)
Calcium: 9 mg/dL (ref 8.9–10.3)
Chloride: 107 mmol/L (ref 98–111)
Creatinine: 0.75 mg/dL (ref 0.44–1.00)
GFR, Estimated: >60 mL/min (ref >60)
Glucose, Bld: 87 mg/dL (ref 70–99)
Potassium: 4.1 mmol/L (ref 3.5–5.1)
Sodium: 136 mmol/L (ref 135–145)
Total Bilirubin: 0.5 mg/dL (ref 0.3–1.2)
Total Protein: 6.3 g/dL — ABNORMAL LOW (ref 6.5–8.1)

## 2021-11-22 LAB — CBC WITH DIFFERENTIAL (CANCER CENTER ONLY)
Abs Immature Granulocytes: 0.04 10*3/uL (ref 0.00–0.07)
Basophils Absolute: 0.1 10*3/uL (ref 0.0–0.1)
Basophils Relative: 1 %
Eosinophils Absolute: 0.1 10*3/uL (ref 0.0–0.5)
Eosinophils Relative: 1 %
HCT: 36.5 % (ref 36.0–46.0)
Hemoglobin: 11.8 g/dL — ABNORMAL LOW (ref 12.0–15.0)
Immature Granulocytes: 1 %
Lymphocytes Relative: 32 %
Lymphs Abs: 2.6 10*3/uL (ref 0.7–4.0)
MCH: 25.4 pg — ABNORMAL LOW (ref 26.0–34.0)
MCHC: 32.3 g/dL (ref 30.0–36.0)
MCV: 78.7 fL — ABNORMAL LOW (ref 80.0–100.0)
Monocytes Absolute: 0.6 10*3/uL (ref 0.1–1.0)
Monocytes Relative: 8 %
Neutro Abs: 4.8 10*3/uL (ref 1.7–7.7)
Neutrophils Relative %: 57 %
Platelet Count: 478 10*3/uL — ABNORMAL HIGH (ref 150–400)
RBC: 4.64 MIL/uL (ref 3.87–5.11)
RDW: 14.2 % (ref 11.5–15.5)
WBC Count: 8.2 10*3/uL (ref 4.0–10.5)
nRBC: 0 % (ref 0.0–0.2)

## 2021-11-22 NOTE — Progress Notes (Signed)
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    Initial hypercoagulable work-up in her chart, she tested negative for factor V Leiden mutation, prothrombin gene mutation, lupus anticoagulant negative, negative beta-2 glycoprotein antibodies, mildly elevated anticardiolipin IgM antibodies and PNH flow was negative.   Labs from 7/18, with mildly elevated anticardiolipin antibodies, normal protein C activity, normal protein S activity, mildly elevated AT III. We have discussed that nephrotic range proteinuria and IgA glomerulonephritis may have created a hypercoagulable state due to an imbalance between naturally occurring procoagulants and anticoagulants.  She is here for FU, doing well overall.  Since last visit, her nephrotic syndrome has significantly improved, urinary protein amount has decreased significantly, she just had a follow-up with her nephrologist.  They are tapering her prednisone, she is currently on prednisone 10 mg daily. No concerning review of systems, she feels very well.  Physical examination without any concerns.  At this time since she has completed 6 months of anticoagulation and since she does not have nephrotic range proteinuria, I do believe she can discontinue anticoagulation.  We have however discussed about symptoms and signs of DVT/PE and the need to go to the hospital immediately with any such symptoms.  I have discussed about risk factors including stasis, trauma, hypercoagulable conditions.  She was advised to avoid any form of birth control with estrogen to reduce her risk of  early clotting.  She expressed understanding of all the recommendations and she will discontinue Eliquis as recommended.  She can return to clinic with [place] in a year.   2. Iron deficiency, reviewed her labs from nephrology, ferritin continues to be lower normal range.  Have recommended continuing ferrous sulfate 65 mg twice a day.  Again she has mild thrombocytosis which is likely from the iron deficiency.  This has corrected 6 months ago when her hemoglobin was better and her iron deficiency was better.  There is also indicates that this is likely secondary to iron deficiency anemia.  We will repeat labs in a year when she comes back for follow-up.  She can also continue following up with her primary care doctor.  3.  Dyslipidemia, very high LDL, started on atorvastatin.  4. Thrombocytosis, likely reactive, will monitor.  HISTORY OF PRESENTING ILLNESS:   Molly Wise 21 y.o. female is here because of popliteal vein DVT  This is a very pleasant 21 year old female patient with no significant past medical history, recently diagnosed with IgA glomerulonephritis and currently on prednisone, also was found to have right lower extremity DVT and pulmonary embolism referred to hematology for anticoagulation recommendations. During her initial visit, hypercoagulable work up was neg, mildly elevated aCL antibodies and antithrombin activity.  She is here for FU.  She is continuing on Eliquis.  She is very happy that her nephrotic range proteinuria has significantly improved.  She just has followed up with her nephrologist, her prednisone dose was decreased to 10 mg p.o. daily.  Hope is to taper it off soon. Since last visit, she has been feeling very well.  No concerns.  No bleeding complaints.  No change in breathing, bowel habits or urinary habits.  She has not  been taking iron supplementation twice a day.  She has only been taking it once a day at the [facility].  Rest of the pertinent 10 point ROS reviewed  and negative.  MEDICAL HISTORY:  No past medical history on file.  SURGICAL HISTORY: No past surgical history on file.  SOCIAL HISTORY: Social History   Socioeconomic History   Marital status: Single    Spouse name: Not on file   Number of children: Not on file   Years of education: Not on file   Highest education level: Not on file  Occupational History   Not on file  Tobacco Use   Smoking status: Never   Smokeless tobacco: Never  Substance and Sexual Activity   Alcohol use: Not on file   Drug use: Not on file   Sexual activity: Not on file  Other Topics Concern   Not on file  Social History Narrative   Is in 11th grade at [employer]   Social Determinants of Health   Financial Resource Strain: Not on file  Food Insecurity: Not on file  Transportation Needs: Not on file  Physical Activity: Not on file  Stress: Not on file  Social Connections: Not on file  Intimate Partner Violence: Not on file    FAMILY HISTORY: Family History  Problem Relation Age of Onset   Hypertension Mother    Berenice Primas' disease Mother    Diabetes Father    Cancer Maternal Grandmother    Cancer Paternal Grandmother    Diabetes Paternal Grandfather    Hyperlipidemia Paternal Grandfather    Kidney disease Paternal Grandfather     ALLERGIES:  has No Known Allergies.  MEDICATIONS:  Current Outpatient Medications  Medication Sig Dispense Refill   Cholecalciferol (VITAMIN D-3) 25 MCG (1000 UT) CAPS Take 1,000-2,000 Units by mouth daily. (Patient not taking: Reported on 08/20/2021)     dapagliflozin propanediol (FARXIGA) 10 MG TABS tablet Take 10 mg by mouth daily.     ELIQUIS 5 MG TABS tablet TAKE 1 TABLET(5 MG) BY MOUTH TWICE DAILY 60 tablet 0   FEROSUL 325 (65 Fe) MG tablet Take 325 mg by mouth daily with breakfast.     losartan (COZAAR) 25 MG tablet Take 12.5 mg by mouth daily.     omeprazole (PRILOSEC) 20 MG capsule Take 20 mg by mouth daily.     oxyCODONE (ROXICODONE) 5 MG immediate  release tablet Take 1 tablet (5 mg total) by mouth every 6 (six) hours as needed for up to 6 doses for severe pain. (Patient not taking: Reported on 08/20/2021) 6 tablet 0   predniSONE (DELTASONE) 20 MG tablet Take 60 mg by mouth daily.     ranitidine (ZANTAC) 150 MG tablet Take 1 tablet (150 mg total) by mouth 2 (two) times daily. (Patient not taking: Reported on 08/20/2021) 60 tablet 6   sulfamethoxazole-trimethoprim (BACTRIM) 400-80 MG tablet Take 1 tablet by mouth daily.     Vitamin D, Ergocalciferol, (DRISDOL) 1.25 MG (50000 UNIT) CAPS capsule Take 50,000 Units by mouth every Sunday.     No current facility-administered medications for this visit.    PHYSICAL EXAMINATION: ECOG PERFORMANCE STATUS: 0  Vitals:   11/22/21 1553  BP: 130/74  Pulse: 85  Resp: 16  Temp: 97.9 F (36.6 C)  SpO2: 100%    Filed Weights   11/22/21 1553  Weight: 235 lb 12.8 oz (107 kg)   GENERAL:alert, no distress and comfortable SKIN: skin color, texture, turgor are normal, no rashes or significant  lesions EYES: normal, conjunctiva are pink and non-injected, sclera clear OROPHARYNX:no exudate, no erythema and lips, buccal mucosa, and tongue normal  NECK: supple, thyroid normal size, non-tender, without nodularity LYMPH:  no palpable lymphadenopathy in the cervical, axillary  LUNGS: clear to auscultation and percussion with normal breathing effort HEART: regular rate & rhythm and no murmurs and no lower extremity edema ABDOMEN:abdomen soft, non-tender and normal bowel sounds Musculoskeletal:no cyanosis of digits and no clubbing  PSYCH: alert & oriented x 3 with fluent speech NEURO: no focal motor/sensory deficits  LABORATORY DATA:  I have reviewed the data as listed Lab Results  Component Value Date   WBC 8.2 11/22/2021   HGB 11.8 (L) 11/22/2021   HCT 36.5 11/22/2021   MCV 78.7 (L) 11/22/2021   PLT 478 (H) 11/22/2021     Chemistry      Component Value Date/Time   NA 140 05/05/2021 1251   K  4.1 05/05/2021 1251   CL 110 05/05/2021 1251   CO2 22 05/05/2021 1251   BUN 10 05/05/2021 1251   CREATININE 0.80 05/05/2021 1251      Component Value Date/Time   CALCIUM 8.7 (L) 05/05/2021 1251       RADIOGRAPHIC STUDIES: I have personally reviewed the radiological images as listed and agreed with the findings in the report. LONG TERM MONITOR (3-14 DAYS)  Result Date: 11/22/2021 Patch Wear Time:  6 days and 19 hours (2022-11-26T13:52:14-0500 to 2022-12-03T09:06:21-0500) Patient had a min HR of 52 bpm, max HR of 187 bpm, and avg HR of 92 bpm. Predominant underlying rhythm was Sinus Rhythm. 3 Supraventricular Tachycardia runs occurred, the run with the fastest interval lasting 16 beats with a max rate of 187 bpm (avg 143 bpm); the run with the fastest interval was also the longest. Isolated SVEs were rare (<1.0%), SVE Couplets were rare (<1.0%), and SVE Triplets were rare (<1.0%). Isolated VEs were rare (<1.0%), and no VE Couplets or VE Triplets were present. Ventricular  Bigeminy was present. SUMMARY: 1. The basic rhythm is normal sinus with an average HR of 92 bpm 2. No atrial fibrillation or flutter 3. No high-grade heart block or pathologic pauses 4. There are rare PVC's and rare supraventricular beats with 3 short supraventricular runs noted above   Summary:  RIGHT:  - Findings consistent with acute deep vein thrombosis involving the right  popliteal vein, right posterior tibial veins, and right peroneal veins.  - There is no evidence of superficial venous thrombosis.     - No cystic structure found in the popliteal fossa.     LEFT:  - No evidence of common femoral vein obstruction.   Factor V leiden Neg Prothrombin gene neg Flow for PNH neg LA neg Beta 2 GP antibodies neg Mildly elevated Ig M ACL ab Ferritin of 32. Normal protein C and S activity Mildly elevated AT III activity  Outside labs reviewed,  All questions were answered. The patient knows to call the clinic with  any problems, questions or concerns. I spent 20 minutes in the care of this patient including H and P, review of records, counseling and coordination of care. Please review above-mentioned assessment and plan for detailed discussion from today.    Benay Pike, MD 11/22/2021 3:59 PM

## 2021-11-23 ENCOUNTER — Encounter: Payer: Self-pay | Admitting: Hematology and Oncology

## 2021-12-14 ENCOUNTER — Ambulatory Visit: Payer: No Typology Code available for payment source | Admitting: Cardiovascular Disease

## 2021-12-27 NOTE — Progress Notes (Signed)
Cardiology Office Note:   Date:  12/28/2021  NAME:  Molly Wise    MRN: [id number] DOB:  10/28/2001   PCP:  Dene Gentry, MD  Cardiologist:  None  Electrophysiologist:  None   Referring MD: Donald Prose, MD   Chief Complaint  Patient presents with   Follow-up         History of Present Illness:   Molly Wise is a 21 y.o. female with a hx of IgA GN, DVT/PE who is being seen today for the evaluation of chest pain/palpitations at the request of Donald Prose, MD. She reports for the last 6 months has had episodes of chest pains and palpitations.  She reports she was diagnosed with a DVT in June 2022.  Apparently this was likely related to protein deficiency in the setting of IgA glomerulonephritis.  She has been taken off anticoagulation.  She is subsequently was diagnosed with a PE in July 2022.  She reports since that time she gets dull and achiness in her chest.  Symptoms can occur 2 days/week.  She reports no identifiable trigger.  She reports no alleviating factors.  Symptoms are not worsened by activity.  They can occur at rest.  They can occur with activity but are not worsened by this.  She reports they can last up to 30 minutes.  She reports it just goes away.  No association with food.  She also reports intermittent episodes of palpitations.  She reports she mainly feels this when she does activity.  She did complete a heart monitor with her primary care physician.  That monitor largely showed sinus rhythm.  She had brief 3, episodes of atrial tachycardia.  The longest episode was 7 seconds.  She did not have any symptoms when she wore the monitor.  She reports symptoms of this have improved.  She wore the monitor in late December.  She does have a history of IgA glomerulonephritis.  She is treated with losartan, Farxiga and prednisone.  She has been tapered off her prednisone.  Her medical history is really only significant for this.  She is obese with a BMI of 39.  EKG  demonstrates sinus rhythm with single PAC.  She reports she does not have any symptoms from this.  There is no family history of heart disease.  She reports poor sleep.  She is currently a Electronics engineer at [employer].  She also reports very little water intake.  She reports she is now working on this.  She may drink 2 to 3 glasses/day.  I have encouraged her to drink 6 to 8/day.  She apparently is a night owl.  She does not get good sleep per mom.  She does report she is a little anxious about her history of DVT and pulmonary embolism.  I do understand this.  She has stopped her blood thinner.  Symptoms of fluttering appear to have largely resolved.  The bigger issue is chest pain.  We discussed pursuing a stress test just to make sure everything is okay.  She did have an echocardiogram in July when she had a pulmonary embolism.  This was normal.  TSH and August 20 22 was normal.  TSH 1.6.  Most recent hemoglobin 11.8.  She is prediabetic with an A1c of 6.4.  LDL 150.  She is on a statin.  She had 3 episodes of atrial tachycardia that lasted brief.  Longest episode was 7 seconds.  She had no symptoms when these occurred.  Problem List IgA Glomerulonephritis  DVT/PE -R popliteal 05/05/2021  Past Medical History: Past Medical History:  Diagnosis Date   Chronic kidney disease    DVT (deep venous thrombosis) ([facility])     Past Surgical History: History reviewed. No pertinent surgical history.  Current Medications: Current Meds  Medication Sig   atorvastatin (LIPITOR) 20 MG tablet Take 20 mg by mouth daily.   Cholecalciferol (VITAMIN D-3) 25 MCG (1000 UT) CAPS Take 1,000-2,000 Units by mouth daily.   dapagliflozin propanediol (FARXIGA) 10 MG TABS tablet Take 10 mg by mouth daily.   FEROSUL 325 (65 Fe) MG tablet Take 325 mg by mouth daily with breakfast.   losartan (COZAAR) 25 MG tablet Take 12.5 mg by mouth daily.   omeprazole (PRILOSEC) 20 MG capsule Take 20 mg by mouth daily.   predniSONE  (DELTASONE) 20 MG tablet Take 60 mg by mouth daily.   Vitamin D, Ergocalciferol, (DRISDOL) 1.25 MG (50000 UNIT) CAPS capsule Take 50,000 Units by mouth every Sunday.     Allergies:    Patient has no known allergies.   Social History: Social History   Socioeconomic History   Marital status: Single    Spouse name: Not on file   Number of children: Not on file   Years of education: Not on file   Highest education level: Not on file  Occupational History   Occupation: Financial risk analyst  Tobacco Use   Smoking status: Never   Smokeless tobacco: Never  Substance and Sexual Activity   Alcohol use: Never   Drug use: Never   Sexual activity: Not on file  Other Topics Concern   Not on file  Social History Narrative   Is in 11th grade at [employer]   Social Determinants of Health   Financial Resource Strain: Not on file  Food Insecurity: Not on file  Transportation Needs: Not on file  Physical Activity: Not on file  Stress: Not on file  Social Connections: Not on file     Family History: The patient's family history includes Cancer in her maternal grandmother and paternal grandmother; Diabetes in her father and paternal grandfather; Berenice Primas' disease in her mother; Hyperlipidemia in her paternal grandfather; Kidney disease in her paternal grandfather.  ROS:   All other ROS reviewed and negative. Pertinent positives noted in the HPI.     EKGs/Labs/Other Studies Reviewed:   The following studies were personally reviewed by me today:  EKG:  EKG is ordered today.  The ekg ordered today demonstrates normal sinus rhythm heart rate 75, PAC noted, and was personally reviewed by me.   [facility] 11/22/2021 SUMMARY: The basic rhythm is normal sinus with an average HR of 92 bpm No atrial fibrillation or flutter No high-grade heart block or pathologic pauses There are rare PVC's and rare supraventricular beats with 3 short supraventricular runs noted above  TTE 05/25/2021  1. Left  ventricle: The left ventricular cavity size is normal.     There is mild concentric hypertrophy noted. Systolic function is     normal. The ejection fraction is 65% (+/-5%). No segmental wall     motion abnormalities. Inconclusive diastolic evaluation due to E     and A wave fusion.  2. No significant valve stenosis or regurgitation.   Recent Labs: 11/22/2021: ALT 7; BUN 12; Creatinine 0.75; Hemoglobin 11.8; Platelet Count 478; Potassium 4.1; Sodium 136   Recent Lipid Panel No results found for: CHOL, TRIG, HDL, CHOLHDL, VLDL, LDLCALC, LDLDIRECT  Physical Exam:  VS:  BP 120/88    Pulse 77    Ht 5\' 5"  (1.651 m)    Wt 238 lb 6.4 oz (108.1 kg)    SpO2 99%    BMI 39.67 kg/m    Wt Readings from Last 3 Encounters:  12/28/21 238 lb 6.4 oz (108.1 kg)  11/22/21 235 lb 12.8 oz (107 kg)  08/20/21 230 lb 9.6 oz (104.6 kg)    General: Well nourished, well developed, in no acute distress Head: Atraumatic, normal size  Eyes: PEERLA, EOMI  Neck: Supple, no JVD Endocrine: No thryomegaly Cardiac: Normal S1, S2; RRR; no murmurs, rubs, or gallops Lungs: Clear to auscultation bilaterally, no wheezing, rhonchi or rales  Abd: Soft, nontender, no hepatomegaly  Ext: No edema, pulses 2+ Musculoskeletal: No deformities, BUE and BLE strength normal and equal Skin: Warm and dry, no rashes   Neuro: Alert and oriented to person, place, time, and situation, CNII-XII grossly intact, no focal deficits  Psych: Normal mood and affect   ASSESSMENT:   Nieves KELE WITHEM is a 21 y.o. female who presents for the following: 1. Precordial pain   2. Palpitations   3. PAC (premature atrial contraction)   4. SVT (supraventricular tachycardia) (HCC)     PLAN:   1. Precordial pain -Noncardiac chest pain.  Echocardiogram in July 2022 was normal in [place].  Records are available through care everywhere.  She describes achiness in her chest that can occur at any time.  Does not appear exertional.  We will proceed with  an exercise treadmill stress test just to make sure there is no underlying CAD.  I suspect this will be normal.  Her EKG in office is normal.  She has a single PAC.  She does suffer from acid reflux.  I suspect this could be the bigger issue here.  Would recommend GI work-up if stress test is negative.  2. Palpitations 3. PAC (premature atrial contraction) 4. SVT (supraventricular tachycardia) ([place]) -She reports fluttering sensation in her chest.  Recent monitor was largely normal.  She had 3 episodes of what appears to be atrial tachycardia.  These episodes were brief and insignificant.  She did not have any symptoms with this.  I did counsel her and her mother that this does not require treatment.  The longest episode was 7 seconds.  When she had her symptoms she had a sinus rhythm.  I suspect this is likely triggered by poor sleep, poor hydration and obesity.  I recommended regular exercise as well as increasing her water intake.  She needs to drink at least 60 to 80 ounces of water per day.  I see no need to repeat a monitor or treat this.  She has a single PAC on her EKG today.  Her echocardiogram in [place] in July 2022 was normal.  She is slightly anemic but on iron supplement.  Her blood pressure is well controlled.  All of her other risk factors seem to be well controlled.  She does not appear to be drinking caffeinated beverages.  Recent TSH is normal.  Cardiovascular exam is normal.  We will continue with a conservative approach.  She will see [place] back as needed.  Shared Decision Making/Informed Consent The risks [chest pain, shortness of breath, cardiac arrhythmias, dizziness, blood pressure fluctuations, myocardial infarction, stroke/transient ischemic attack, and life-threatening complications (estimated to be 1 in 10,000)], benefits (risk stratification, diagnosing coronary artery disease, treatment guidance) and alternatives of an exercise tolerance test were discussed in detail with  Ms.  Kivi and she agrees to proceed.  Disposition: Return if symptoms worsen or fail to improve.  Medication Adjustments/Labs and Tests Ordered: Current medicines are reviewed at length with the patient today.  Concerns regarding medicines are outlined above.  Orders Placed This Encounter  Procedures   Cardiac Stress Test: Informed Consent Details: Physician/Practitioner Attestation; Transcribe to consent form and obtain patient signature   EXERCISE TOLERANCE TEST (ETT)   EKG 12-Lead   No orders of the defined types were placed in this encounter.   Patient Instructions  Medication Instructions:  The current medical regimen is effective;  continue present plan and medications.  *If you need a refill on your cardiac medications before your next appointment, please call your pharmacy*   Testing/Procedures: Your physician has requested that you have an exercise tolerance test, this is a screening tool to track your fitness level. This test evaluates the your exercise capacity by measuring cardiovascular response to exercise, the stress response is induced by exercise (exercise-treadmill).  Graded exercise test is also known as maximal exercise test or stress EKG test  . Please also follow instruction sheet given.    Follow-Up: At [facility], you and your health needs are our priority.  As part of our continuing mission to provide you with exceptional heart care, we have created designated Provider Care Teams.  These Care Teams include your primary Cardiologist (physician) and Advanced Practice Providers (APPs -  Physician Assistants and Nurse Practitioners) who all work together to provide you with the care you need, when you need it.  We recommend signing up for the patient portal called "MyChart".  Sign up information is provided on this After Visit Summary.  MyChart is used to connect with patients for Virtual Visits (Telemedicine).  Patients are able to view lab/test results,  encounter notes, upcoming appointments, etc.  Non-urgent messages can be sent to your provider as well.   To learn more about what you can do with MyChart, go to [email or web address].    Your next appointment:   As needed  The format for your next appointment:   In Person  Provider:   Eleonore Chiquito, MD      Signed, Addison Naegeli. Audie Box, MD, [place]  [address] [place], [place] [address] [phone]  12/28/2021 3:30 PM

## 2021-12-28 ENCOUNTER — Other Ambulatory Visit: Payer: Self-pay

## 2021-12-28 ENCOUNTER — Encounter: Payer: Self-pay | Admitting: Cardiovascular Disease

## 2021-12-28 ENCOUNTER — Ambulatory Visit (INDEPENDENT_AMBULATORY_CARE_PROVIDER_SITE_OTHER): Payer: No Typology Code available for payment source | Admitting: Cardiovascular Disease

## 2021-12-28 VITALS — BP 120/88 | HR 77 | Ht 65.0 in | Wt 238.4 lb

## 2021-12-28 DIAGNOSIS — R072 Precordial pain: Secondary | ICD-10-CM | POA: Diagnosis not present

## 2021-12-28 DIAGNOSIS — R002 Palpitations: Secondary | ICD-10-CM

## 2021-12-28 DIAGNOSIS — I491 Atrial premature depolarization: Secondary | ICD-10-CM | POA: Diagnosis not present

## 2021-12-28 DIAGNOSIS — I471 Supraventricular tachycardia: Secondary | ICD-10-CM | POA: Diagnosis not present

## 2021-12-28 NOTE — Patient Instructions (Signed)
Medication Instructions:  The current medical regimen is effective;  continue present plan and medications.  *If you need a refill on your cardiac medications before your next appointment, please call your pharmacy*   Testing/Procedures: Your physician has requested that you have an exercise tolerance test, this is a screening tool to track your fitness level. This test evaluates the your exercise capacity by measuring cardiovascular response to exercise, the stress response is induced by exercise (exercise-treadmill).  Graded exercise test is also known as maximal exercise test or stress EKG test  . Please also follow instruction sheet given.    Follow-Up: At [facility], you and your health needs are our priority.  As part of our continuing mission to provide you with exceptional heart care, we have created designated Provider Care Teams.  These Care Teams include your primary Cardiologist (physician) and Advanced Practice Providers (APPs -  Physician Assistants and Nurse Practitioners) who all work together to provide you with the care you need, when you need it.  We recommend signing up for the patient portal called "MyChart".  Sign up information is provided on this After Visit Summary.  MyChart is used to connect with patients for Virtual Visits (Telemedicine).  Patients are able to view lab/test results, encounter notes, upcoming appointments, etc.  Non-urgent messages can be sent to your provider as well.   To learn more about what you can do with MyChart, go to [email or web address].    Your next appointment:   As needed  The format for your next appointment:   In Person  Provider:   Eleonore Chiquito, MD

## 2022-01-03 ENCOUNTER — Telehealth: Payer: Self-pay | Admitting: *Deleted

## 2022-01-03 NOTE — Telephone Encounter (Signed)
This RN received call from the patient's mother stating Marguetta is having " chest pain " x 1 week "is this because she stopped the blood thinners last month ?  Per call- this RN was able to speak with pt as well- she states discomfort in chest started approximately Tuesday of last week.  Discomfort is present with mild worsening by exercise.  She denies any cough, fever or shortness of breath. The pt was able to talk with this RN in complete sentences without disruption.  Chest discomfort does not worsen with position and she is able to sleep with interruption ( has some difficulty falling asleep ).  She denies any leg swelling.  Prednisone was decreased to 5 mg on Friday (post occurrence of discomfort ).  Of note post asking above questions this RN was informed pt is on her way to the ER in [place] with [place] as location.  Her mother is asking if Drianna should " take one of her blood thinners ?"  This RN informed both the pt and her mother - due to pt presently on way to the ER- would be most prudent for her to have full assessment by ER provider for best outcome.  This RN will follow up per Care Everywhere as well as informed MD of above.

## 2022-01-09 ENCOUNTER — Telehealth (HOSPITAL_COMMUNITY): Payer: Self-pay | Admitting: *Deleted

## 2022-01-09 NOTE — Telephone Encounter (Signed)
Close encounter 

## 2022-01-11 ENCOUNTER — Other Ambulatory Visit: Payer: Self-pay

## 2022-01-11 ENCOUNTER — Ambulatory Visit (HOSPITAL_COMMUNITY)
Admission: RE | Admit: 2022-01-11 | Discharge: 2022-01-11 | Disposition: A | Discharge status: Home / Self Care | Payer: No Typology Code available for payment source | Source: Ambulatory Visit | Attending: Cardiology | Admitting: Cardiology

## 2022-01-11 DIAGNOSIS — R072 Precordial pain: Secondary | ICD-10-CM | POA: Insufficient documentation

## 2022-01-11 LAB — EXERCISE TOLERANCE TEST
Angina Index: 1
Base ST Depression (mm): 0 mm
Duke Treadmill Score: 3
Estimated workload: 8.7
Exercise duration (min): 7 min
Exercise duration (sec): 8 s
MPHR: 200 {beats}/min
Peak HR: 181 {beats}/min
Percent HR: 90 %
Rest HR: 91 {beats}/min
ST Depression (mm): 0 mm

## 2022-01-14 ENCOUNTER — Encounter: Payer: Self-pay | Admitting: Cardiovascular Disease

## 2022-01-15 MED ORDER — OMEPRAZOLE 20 MG PO CPDR: 20.0000 mg | DELAYED_RELEASE_CAPSULE | Freq: Every day | ORAL | 3 refills | Status: DC

## 2022-01-29 ENCOUNTER — Telehealth: Payer: Self-pay

## 2022-01-29 NOTE — Telephone Encounter (Signed)
Pt's mother, Brett Albino called and states pt is continuing to have chest pains, more with activity. There was no resolution in ED, and PCP made referral to GI for pt. Mother is concerned as these symptoms are not getting better and is asking for advice.  ?

## 2022-01-30 NOTE — Telephone Encounter (Signed)
Pt's mom understands recommendation and states she will take Menaal to PCP. ?

## 2022-02-20 ENCOUNTER — Ambulatory Visit (INDEPENDENT_AMBULATORY_CARE_PROVIDER_SITE_OTHER): Payer: No Typology Code available for payment source | Admitting: Pulmonary Disease

## 2022-02-20 ENCOUNTER — Encounter: Payer: Self-pay | Admitting: Pulmonary Disease

## 2022-02-20 VITALS — BP 122/84 | HR 80 | Temp 98.1°F | Ht 66.0 in | Wt 246.0 lb

## 2022-02-20 DIAGNOSIS — R0789 Other chest pain: Secondary | ICD-10-CM | POA: Diagnosis not present

## 2022-02-20 DIAGNOSIS — Z86711 Personal history of pulmonary embolism: Secondary | ICD-10-CM | POA: Diagnosis not present

## 2022-02-20 MED ORDER — ALBUTEROL SULFATE HFA 108 (90 BASE) MCG/ACT IN AERS: 2.0000 | INHALATION_SPRAY | Freq: Four times a day (QID) | RESPIRATORY_TRACT | 2 refills | Status: DC | PRN

## 2022-02-20 NOTE — Patient Instructions (Signed)
Thank you for visiting Dr. Valeta Harms at [facility]. ?Today we recommend the following: ? ?Start '81mg'$  ASA Daily to help with prevention  ? ?Meds ordered this encounter  ?Medications  ? albuterol (VENTOLIN HFA) 108 (90 Base) MCG/ACT inhaler  ?  Sig: Inhale 2 puffs into the lungs every 6 (six) hours as needed for wheezing or shortness of breath.  ?  Dispense:  8 g  ?  Refill:  2  ? ?Return if symptoms worsen or fail to improve. ? ? ? ?Please do your part to reduce the spread of COVID-19.  ? ?

## 2022-02-20 NOTE — Progress Notes (Signed)
? ?Synopsis: Referred in April 2023 for chest pain by Dene Gentry, MD ? ?Subjective:  ? ?PATIENT ID: Molly Wise GENDER: female DOB: July 18, 2001, MRN: [id number] ? ?Chief Complaint  ?Patient presents with  ? Pulmonary Consult  ?  Self referral.  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 She was on Eliquis x 6 months. Pt c/o CP for the past 2 months- describes as achy feeling down the center of her chest. She had DOE walking a flight of stairs.   ? ? 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  Follows with Dr. Carolin Sicks.  Several months ago patient found to have swelling and pain in the leg.  Was diagnosed with a DVT in June.  Had chest pain went to the ER in July had CT scan of the chest with pulmonary embolism.  Patient was treated with 6 months of Eliquis.  She has had a significant amount of work-up from her primary care office, oncology hematology office for VTE.  Also has this complaints of centralized chest pain that sometimes wraps around underneath the breast.  It has been going on for several months predominantly since February.  She has seen primary care, cardiology and orthopedics.  Patient's mother present today in the office.  Both of which are seemingly frustrated that they do not have an answer as to why the pain continues.  Patient states that she has the pain constantly at a level 4.  It is worse when she exerts herself.  She has taken over-the-counter medications for pain control such as ibuprofen or Tylenol which has not had any significant improvement. ? ? ? ? ?Past Medical History:  ?Diagnosis Date  ? Chronic kidney disease   ? DVT (deep venous thrombosis) ([place])   ?  ? ?Family History  ?Problem Relation Age of Onset  ? Graves' disease Mother   ? Diabetes Father   ? Cancer Maternal Grandmother   ? Cancer - Lung Paternal Grandmother   ?     smoked  ? Cancer Paternal Grandmother   ? Diabetes Paternal Grandfather   ? Hyperlipidemia Paternal Grandfather   ? Kidney  disease Paternal Grandfather   ?  ? ?No past surgical history on file. ? ?Social History  ? ?Socioeconomic History  ? Marital status: Single  ?  Spouse name: Not on file  ? Number of children: Not on file  ? Years of education: Not on file  ? Highest education level: Not on file  ?Occupational History  ? Occupation: Financial risk analyst  ?Tobacco Use  ? Smoking status: Never  ? Smokeless tobacco: Never  ?Vaping Use  ? Vaping Use: Never used  ?Substance and Sexual Activity  ? Alcohol use: Never  ? Drug use: Never  ? Sexual activity: Not on file  ?Other Topics Concern  ? Not on file  ?Social History Narrative  ? Is in 11th grade at [employer]  ? ?Social Determinants of Health  ? ?Financial Resource Strain: Not on file  ?Food Insecurity: Not on file  ?Transportation Needs: Not on file  ?Physical Activity: Not on file  ?Stress: Not on file  ?Social Connections: Not on file  ?Intimate Partner Violence: Not on file  ?  ? ?No Known Allergies  ? ?Outpatient Medications Prior to Visit  ?Medication Sig Dispense Refill  ? dapagliflozin propanediol (FARXIGA) 10 MG TABS tablet Take 10 mg by mouth daily.    ? FEROSUL 325 (65 Fe) MG tablet  Take 325 mg by mouth daily with breakfast.    ? losartan (COZAAR) 25 MG tablet Take 12.5 mg by mouth daily.    ? omeprazole (PRILOSEC) 20 MG capsule Take 1 capsule (20 mg total) by mouth daily. 90 capsule 3  ? predniSONE 5 MG TBEC Take 1 tablet by mouth daily.    ? atorvastatin (LIPITOR) 20 MG tablet Take 20 mg by mouth daily. (Patient not taking: Reported on 02/20/2022)    ? Cholecalciferol (VITAMIN D-3) 25 MCG (1000 UT) CAPS Take 1,000-2,000 Units by mouth daily.    ? predniSONE (DELTASONE) 20 MG tablet Take 60 mg by mouth daily.    ? Vitamin D, Ergocalciferol, (DRISDOL) 1.25 MG (50000 UNIT) CAPS capsule Take 50,000 Units by mouth every Sunday.    ? ?No facility-administered medications prior to visit.  ? ? ?Review of Systems  ?Constitutional:  Negative for chills, fever, malaise/fatigue and  weight loss.  ?HENT:  Negative for hearing loss, sore throat and tinnitus.   ?Eyes:  Negative for blurred vision and double vision.  ?Respiratory:  Negative for cough, hemoptysis, sputum production, shortness of breath, wheezing and stridor.   ?Cardiovascular:  Positive for chest pain. Negative for palpitations, orthopnea, leg swelling and PND.  ?Gastrointestinal:  Negative for abdominal pain, constipation, diarrhea, heartburn, nausea and vomiting.  ?Genitourinary:  Negative for dysuria, hematuria and urgency.  ?Musculoskeletal:  Negative for joint pain and myalgias.  ?Skin:  Negative for itching and rash.  ?Neurological:  Negative for dizziness, tingling, weakness and headaches.  ?Endo/Heme/Allergies:  Negative for environmental allergies. Does not bruise/bleed easily.  ?Psychiatric/Behavioral:  Negative for depression. The patient is not nervous/anxious and does not have insomnia.   ?All other systems reviewed and are negative. ? ? ?Objective:  ?Physical Exam ?Vitals reviewed.  ?Constitutional:   ?   General: She is not in acute distress. ?   Appearance: She is well-developed.  ?HENT:  ?   Head: Normocephalic and atraumatic.  ?Eyes:  ?   General: No scleral icterus. ?   Conjunctiva/sclera: Conjunctivae normal.  ?   Pupils: Pupils are equal, round, and reactive to light.  ?Neck:  ?   Vascular: No JVD.  ?   Trachea: No tracheal deviation.  ?Cardiovascular:  ?   Rate and Rhythm: Normal rate and regular rhythm.  ?   Heart sounds: Normal heart sounds. No murmur heard. ?Pulmonary:  ?   Effort: Pulmonary effort is normal. No tachypnea, accessory muscle usage or respiratory distress.  ?   Breath sounds: No stridor. No wheezing, rhonchi or rales.  ?Abdominal:  ?   General: There is no distension.  ?   Palpations: Abdomen is soft.  ?   Tenderness: There is no abdominal tenderness.  ?Musculoskeletal:     ?   General: No tenderness.  ?   Cervical back: Neck supple.  ?Lymphadenopathy:  ?   Cervical: No cervical adenopathy.   ?Skin: ?   General: Skin is warm and dry.  ?   Capillary Refill: Capillary refill takes less than 2 seconds.  ?   Findings: No rash.  ?Neurological:  ?   Mental Status: She is alert and oriented to person, place, and time.  ?Psychiatric:     ?   Behavior: Behavior normal.  ? ? ? ?Vitals:  ? 02/20/22 0906  ?BP: 122/84  ?Pulse: 80  ?Temp: 98.1 ?F (36.7 ?C)  ?TempSrc: Oral  ?SpO2: 100%  ?Weight: 246 lb (111.6 kg)  ?Height: '5\' 6"'$  (1.676 m)  ? ?  100% on RA ?BMI Readings from Last 3 Encounters:  ?02/20/22 39.71 kg/m?  ?12/28/21 39.67 kg/m?  ?11/22/21 39.24 kg/m?  ? ?Wt Readings from Last 3 Encounters:  ?02/20/22 246 lb (111.6 kg)  ?12/28/21 238 lb 6.4 oz (108.1 kg)  ?11/22/21 235 lb 12.8 oz (107 kg)  ? ? ? ?CBC ?   ?Component Value Date/Time  ? WBC 8.2 11/22/2021 1537  ? WBC 7.7 05/05/2021 1251  ? RBC 4.64 11/22/2021 1537  ? HGB 11.8 (L) 11/22/2021 1537  ? HCT 36.5 11/22/2021 1537  ? PLT 478 (H) 11/22/2021 1537  ? MCV 78.7 (L) 11/22/2021 1537  ? MCH 25.4 (L) 11/22/2021 1537  ? MCHC 32.3 11/22/2021 1537  ? RDW 14.2 11/22/2021 1537  ? LYMPHSABS 2.6 11/22/2021 1537  ? MONOABS 0.6 11/22/2021 1537  ? EOSABS 0.1 11/22/2021 1537  ? BASOSABS 0.1 11/22/2021 1537  ? ? ?Chest Imaging: ?Recent CTA chest completed at [facility] on 01/03/2022 reveals no evidence of thromboembolic disease. ?The patient's images have been independently reviewed by me.   ? ?Pulmonary Functions Testing Results: ?   ? View : No data to display.  ?  ?  ?  ? ? ?FeNO:  ? ?Pathology:  ? ?Echocardiogram:  ? ?Heart Catheterization:  ?   ?Assessment & Plan:  ? ?  ICD-10-CM   ?1. Other chest pain  R07.89   ?  ?2. Chest tightness  R07.89   ?  ?3. History of pulmonary embolism  Z86.711   ?  ? ? ?Discussion: ? ?This is a 21 year old female, presents today for discussion regarding chest tightness and chest discomfort that has been not explained.  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  She had a lower  extremity DVT as well as a pulmonary embolism.  She was also diagnosed over the past year with IgA kidney disease.  Currently on prednisone.  She is also obese with a BMI of 39. ? ?Plan: ?No clear etiology was e

## 2022-04-03 NOTE — Progress Notes (Signed)
Cardiology Office Note:   Date:  04/05/2022  NAME:  Molly Wise DOBOSZ    MRN: [id number] DOB:  Nov 03, 2001   PCP:  Donald Prose, MD  Cardiologist:  None  Electrophysiologist:  None   Referring MD: Dene Gentry, MD   Chief Complaint  Patient presents with   Chest Pain   History of Present Illness:   Molly Wise is a 21 y.o. female with a hx of DVT/PE, IgA nephropathy who presents for follow-up of CP. TSH and August 20 22 was normal.  TSH 1.6.  Most recent hemoglobin 11.8.  She is prediabetic with an A1c of 6.4.  LDL 150.  She is on a statin. She had 3 episodes of atrial tachycardia that lasted brief.  Longest episode was 7 seconds.  She had no symptoms when these occurred. Continues to report symptoms of chest pain.  Described as constant achiness in the center of her chest.  Worse with exertion.  Symptoms occur daily.  She reports she just does not think about it and goes about her day.  She has been seen by pulmonary.  No significant improvement with albuterol.  She has been seen by gastroenterology.  No improvement with omeprazole.  She underwent a stress test that was unremarkable.  Cardiovascular examination normal.  EKG is normal.  Really unclear what is causing her symptoms.  I do not believe this is her heart.  We discussed proceeding with a low-dose coronary CTA.  Would like to avoid radiation in this young female.  I would also like to check inflammatory markers.  She is been placed on prednisone but symptoms have really not improved.  I really am at a loss to what could explain her symptoms but overall does not appear to be cardiac.  Coronary CTA flash protocol would be our last resort.  Problem List IgA Glomerulonephritis  DVT/PE -R popliteal 05/05/2021  Past Medical History: Past Medical History:  Diagnosis Date   Chronic kidney disease    DVT (deep venous thrombosis) ([place])     Past Surgical History: History reviewed. No pertinent surgical history.  Current  Medications: Current Meds  Medication Sig   atorvastatin (LIPITOR) 20 MG tablet Take 20 mg by mouth daily.   dapagliflozin propanediol (FARXIGA) 10 MG TABS tablet Take 10 mg by mouth daily.   FEROSUL 325 (65 Fe) MG tablet Take 325 mg by mouth daily with breakfast.   losartan (COZAAR) 25 MG tablet Take 12.5 mg by mouth daily.     Allergies:    Patient has no known allergies.   Social History: Social History   Socioeconomic History   Marital status: Single    Spouse name: Not on file   Number of children: Not on file   Years of education: Not on file   Highest education level: Not on file  Occupational History   Occupation: Financial risk analyst  Tobacco Use   Smoking status: Never   Smokeless tobacco: Never  Vaping Use   Vaping Use: Never used  Substance and Sexual Activity   Alcohol use: Never   Drug use: Never   Sexual activity: Not on file  Other Topics Concern   Not on file  Social History Narrative   Is in 11th grade at [employer]   Social Determinants of Health   Financial Resource Strain: Not on file  Food Insecurity: Not on file  Transportation Needs: Not on file  Physical Activity: Not on file  Stress: Not on file  Social  Connections: Not on file     Family History: The patient's family history includes Cancer in her maternal grandmother and paternal grandmother; Cancer - Lung in her paternal grandmother; Diabetes in her father and paternal grandfather; Berenice Primas' disease in her mother; Hyperlipidemia in her paternal grandfather; Kidney disease in her paternal grandfather.  ROS:   All other ROS reviewed and negative. Pertinent positives noted in the HPI.     EKGs/Labs/Other Studies Reviewed:   The following studies were personally reviewed by me today:  EKG:  EKG is ordered today.  The ekg ordered today demonstrates normal sinus rhythm heart rate 70, no acute ischemic changes or evidence of infarction, and was personally reviewed by me.   ETT 01/11/2022    No ST deviation was noted.   Exercise time 7 minutes and 8 seconds.  Appropriate blood pressure response.   Chest discomfort felt at 4 minutes, shortness of breath at 6 minutes and 23 seconds.  Symptoms resolved at 8 minutes.   Rare PVCs noted during stress   Overall moderate risk exercise treadmill test with Duke treadmill score of 3 based upon exercise time as well as nonlimiting chest pain.  There is no electrocardiographic evidence of ischemia.  TTE 05/25/2021 [facility]  1. Left ventricle: The left ventricular cavity size is normal.     There is mild concentric hypertrophy noted. Systolic function is     normal. The ejection fraction is 65% (+/-5%). No segmental wall     motion abnormalities. Inconclusive diastolic evaluation due to E     and A wave fusion.  2. No significant valve stenosis or regurgitation.   Recent Labs: 11/22/2021: ALT 7; BUN 12; Creatinine 0.75; Hemoglobin 11.8; Platelet Count 478; Potassium 4.1; Sodium 136   Recent Lipid Panel No results found for: CHOL, TRIG, HDL, CHOLHDL, VLDL, LDLCALC, LDLDIRECT  Physical Exam:   VS:  BP 116/74   Pulse 70   Ht 5' 6"  (1.676 m)   Wt 239 lb 6.4 oz (108.6 kg)   SpO2 99%   BMI 38.64 kg/m    Wt Readings from Last 3 Encounters:  04/05/22 239 lb 6.4 oz (108.6 kg)  02/20/22 246 lb (111.6 kg)  12/28/21 238 lb 6.4 oz (108.1 kg)    General: Well nourished, well developed, in no acute distress Head: Atraumatic, normal size  Eyes: PEERLA, EOMI  Neck: Supple, no JVD Endocrine: No thryomegaly Cardiac: Normal S1, S2; RRR; no murmurs, rubs, or gallops Lungs: Clear to auscultation bilaterally, no wheezing, rhonchi or rales  Abd: Soft, nontender, no hepatomegaly  Ext: No edema, pulses 2+ Musculoskeletal: No deformities, BUE and BLE strength normal and equal Skin: Warm and dry, no rashes   Neuro: Alert and oriented to person, place, time, and situation, CNII-XII grossly intact, no focal deficits  Psych: Normal mood and affect    ASSESSMENT:   Molly Wise is a 21 y.o. female who presents for the following: 1. Precordial pain   2. Palpitations     PLAN:   1. Precordial pain 2. Palpitations -Continues to report constant achiness in her chest.  Exercise treadmill stress test was negative.  Echocardiogram in [place] was normal.  She was diagnosed with pulmonary embolism and DVT in August 2022.  Repeat CT PE study in February of this year was negative.  Her symptoms have not improved.  She has been seen by pulmonary.  Albuterol inhaler does not help her symptoms.  She has been seen by gastroenterology and omeprazole does not help  her symptoms.  Given her normal echo and normal stress test and young age I do not believe obstructive CAD explains her symptoms.  The only thing I can offer her is coronary CTA with a low-dose radiation protocol to exclude any anomalous coronary origins.  I really have a low suspicion for any CAD explanation.  We also will check inflammatory markers today.  She has been on prednisone with no improvement.  I do not believe this is pericarditis.  Symptoms are constant and not classic for this.  I really do not believe this is cardiac but we will ensure this with a low-dose radiation coronary CTA.  She will see [place] back as needed based on the results of the scan.  If she decides she is not want to do the scan I think this is reasonable.  Her young age really is low risk for any significant pathology.  Again we could be looking for something congenital but would expect this up to of already presented itself.      Disposition: Return if symptoms worsen or fail to improve.  Medication Adjustments/Labs and Tests Ordered: Current medicines are reviewed at length with the patient today.  Concerns regarding medicines are outlined above.  No orders of the defined types were placed in this encounter.  No orders of the defined types were placed in this encounter.   Patient Instructions  Medication  Instructions:  Take Metoprolol 50 mg two hours before CT when scheduled.  *If you need a refill on your cardiac medications before your next appointment, please call your pharmacy*   Lab Work: BMET, ESR, CRP today   If you have labs (blood work) drawn today and your tests are completely normal, you will receive your results only by: [place] (if you have MyChart) OR A paper copy in the mail If you have any lab test that is abnormal or we need to change your treatment, we will call you to review the results.   Testing/Procedures: Your physician has requested that you have cardiac CT. Cardiac computed tomography (CT) is a painless test that uses an x-ray machine to take clear, detailed pictures of your heart. For further information please visit HugeFiesta.[email or web address]. Please follow instruction sheet as given.   Follow-Up: At [facility], you and your health needs are our priority.  As part of our continuing mission to provide you with exceptional heart care, we have created designated Provider Care Teams.  These Care Teams include your primary Cardiologist (physician) and Advanced Practice Providers (APPs -  Physician Assistants and Nurse Practitioners) who all work together to provide you with the care you need, when you need it.  We recommend signing up for the patient portal called "MyChart".  Sign up information is provided on this After Visit Summary.  MyChart is used to connect with patients for Virtual Visits (Telemedicine).  Patients are able to view lab/test results, encounter notes, upcoming appointments, etc.  Non-urgent messages can be sent to your provider as well.   To learn more about what you can do with MyChart, go to [email or web address].    Your next appointment:   As needed  The format for your next appointment:   In Person  Provider:   Eleonore Chiquito, MD    Other Instructions   Your cardiac CT will be scheduled at one of the below locations:    [facility] [address] [place], [place] [address] [phone]  If scheduled at [facility], please arrive  at the [facility] Entrance (Entrance C2) of [facility] 30 minutes prior to test start time. You can use the FREE valet parking offered at entrance C (encouraged to control the heart rate for the test)  Proceed to the [facility] Radiology Department (first floor) to check-in and test prep.  All radiology patients and guests should use entrance C2 at [facility], accessed from [facility], even though the hospital's physical address listed is [address].     Please follow these instructions carefully (unless otherwise directed):  Hold all erectile dysfunction medications at least 3 days (72 hrs) prior to test.  On the Night Before the Test: Be sure to Drink plenty of water. Do not consume any caffeinated/decaffeinated beverages or chocolate 12 hours prior to your test. Do not take any antihistamines 12 hours prior to your test.  On the Day of the Test: Drink plenty of water until 1 hour prior to the test. Do not eat any food 4 hours prior to the test. You may take your regular medications prior to the test.  Take metoprolol (Lopressor) two hours prior to test. HOLD Furosemide/Hydrochlorothiazide morning of the test. FEMALES- please wear underwire-free bra if available, avoid dresses & tight clothing     After the Test: Drink plenty of water. After receiving IV contrast, you may experience a mild flushed feeling. This is normal. On occasion, you may experience a mild rash up to 24 hours after the test. This is not dangerous. If this occurs, you can take Benadryl 25 mg and increase your fluid intake. If you experience trouble breathing, this can be serious. If it is severe call 911 IMMEDIATELY. If it is mild, please call our office. If you take any of these medications: Glipizide/Metformin,  Avandament, Glucavance, please do not take 48 hours after completing test unless otherwise instructed.  We will call to schedule your test 2-4 weeks out understanding that some insurance companies will need an authorization prior to the service being performed.   For non-scheduling related questions, please contact the cardiac imaging nurse navigator should you have any questions/concerns: Marchia Bond, Cardiac Imaging Nurse Navigator Gordy Clement, Cardiac Imaging Nurse Navigator [facility] Direct Office Dial: [phone]   For scheduling needs, including cancellations and rescheduling, please call [place], [phone].           Time Spent with Patient: I have spent a total of 35 minutes with patient reviewing hospital notes, telemetry, EKGs, labs and examining the patient as well as establishing an assessment and plan that was discussed with the patient.  > 50% of time was spent in direct patient care.  Signed, Addison Naegeli. Audie Box, MD, [place]  [address], [place],  [address] [phone]  04/05/2022 9:10 AM

## 2022-04-05 ENCOUNTER — Encounter: Payer: Self-pay | Admitting: Cardiovascular Disease

## 2022-04-05 ENCOUNTER — Ambulatory Visit (INDEPENDENT_AMBULATORY_CARE_PROVIDER_SITE_OTHER): Payer: No Typology Code available for payment source | Admitting: Cardiovascular Disease

## 2022-04-05 VITALS — BP 116/74 | HR 70 | Ht 66.0 in | Wt 239.4 lb

## 2022-04-05 DIAGNOSIS — R072 Precordial pain: Secondary | ICD-10-CM

## 2022-04-05 DIAGNOSIS — R002 Palpitations: Secondary | ICD-10-CM | POA: Diagnosis not present

## 2022-04-05 MED ORDER — METOPROLOL TARTRATE 50 MG PO TABS: ORAL_TABLET | ORAL | 0 refills | Status: DC

## 2022-04-05 NOTE — Patient Instructions (Signed)
Medication Instructions:  Take Metoprolol 50 mg two hours before CT when scheduled.  *If you need a refill on your cardiac medications before your next appointment, please call your pharmacy*   Lab Work: BMET, ESR, CRP today   If you have labs (blood work) drawn today and your tests are completely normal, you will receive your results only by: [place] (if you have MyChart) OR A paper copy in the mail If you have any lab test that is abnormal or we need to change your treatment, we will call you to review the results.   Testing/Procedures: Your physician has requested that you have cardiac CT. Cardiac computed tomography (CT) is a painless test that uses an x-ray machine to take clear, detailed pictures of your heart. For further information please visit [email or web address]. Please follow instruction sheet as given.   Follow-Up: At [facility], you and your health needs are our priority.  As part of our continuing mission to provide you with exceptional heart care, we have created designated Provider Care Teams.  These Care Teams include your primary Cardiologist (physician) and Advanced Practice Providers (APPs -  Physician Assistants and Nurse Practitioners) who all work together to provide you with the care you need, when you need it.  We recommend signing up for the patient portal called "MyChart".  Sign up information is provided on this After Visit Summary.  MyChart is used to connect with patients for Virtual Visits (Telemedicine).  Patients are able to view lab/test results, encounter notes, upcoming appointments, etc.  Non-urgent messages can be sent to your provider as well.   To learn more about what you can do with MyChart, go to [email or web address].    Your next appointment:   As needed  The format for your next appointment:   In Person  Provider:   Eleonore Chiquito, MD    Other Instructions   Your cardiac CT will be scheduled at one of the below  locations:   [facility] [address] [place], [place] [address] [phone]  If scheduled at [facility], please arrive at the [facility]'s Entrance (Entrance C2) of [facility] [place] 30 minutes prior to test start time. You can use the FREE valet parking offered at entrance C (encouraged to control the heart rate for the test)  Proceed to the [facility] Radiology Department (first floor) to check-in and test prep.  All radiology patients and guests should use entrance C2 at [facility], accessed from [facility], even though the hospital's physical address listed is [address]     Please follow these instructions carefully (unless otherwise directed):  Hold all erectile dysfunction medications at least 3 days (72 hrs) prior to test.  On the Night Before the Test: Be sure to Drink plenty of water. Do not consume any caffeinated/decaffeinated beverages or chocolate 12 hours prior to your test. Do not take any antihistamines 12 hours prior to your test.  On the Day of the Test: Drink plenty of water until 1 hour prior to the test. Do not eat any food 4 hours prior to the test. You may take your regular medications prior to the test.  Take metoprolol (Lopressor) two hours prior to test. HOLD Furosemide/Hydrochlorothiazide morning of the test. FEMALES- please wear underwire-free bra if available, avoid dresses & tight clothing     After the Test: Drink plenty of water. After receiving IV contrast, you may experience a mild flushed feeling. This  is normal. On occasion, you may experience a mild rash up to 24 hours after the test. This is not dangerous. If this occurs, you can take Benadryl 25 mg and increase your fluid intake. If you experience trouble breathing, this can be serious. If it is severe call 911 IMMEDIATELY. If it is mild, please call our office. If you take any of these medications:  Glipizide/Metformin, Avandament, Glucavance, please do not take 48 hours after completing test unless otherwise instructed.  We will call to schedule your test 2-4 weeks out understanding that some insurance companies will need an authorization prior to the service being performed.   For non-scheduling related questions, please contact the cardiac imaging nurse navigator should you have any questions/concerns: Marchia Bond, Cardiac Imaging Nurse Navigator Gordy Clement, Cardiac Imaging Nurse Navigator [facility] Direct Office Dial: [phone]   For scheduling needs, including cancellations and rescheduling, please call [place], [phone].

## 2022-04-06 LAB — BASIC METABOLIC PANEL
BUN/Creatinine Ratio: 20 (ref 9–23)
BUN: 15 mg/dL (ref 6–20)
CO2: 18 mmol/L — ABNORMAL LOW (ref 20–29)
Calcium: 9.4 mg/dL (ref 8.7–10.2)
Chloride: 103 mmol/L (ref 96–106)
Creatinine, Ser: 0.75 mg/dL (ref 0.57–1.00)
Glucose: 86 mg/dL (ref 70–99)
Potassium: 4.7 mmol/L (ref 3.5–5.2)
Sodium: 139 mmol/L (ref 134–144)
eGFR: 116 mL/min/{1.73_m2} (ref >59)

## 2022-04-06 LAB — C-REACTIVE PROTEIN: CRP: 10 mg/L (ref 0–10)

## 2022-04-06 LAB — SEDIMENTATION RATE: Sed Rate: 85 mm/hr — ABNORMAL HIGH (ref 0–32)

## 2022-04-16 ENCOUNTER — Other Ambulatory Visit: Payer: Self-pay

## 2022-04-16 MED ORDER — METOPROLOL TARTRATE 50 MG PO TABS: ORAL_TABLET | ORAL | 3 refills | Status: DC

## 2022-04-17 ENCOUNTER — Encounter: Payer: Self-pay | Admitting: Cardiovascular Disease

## 2022-04-17 ENCOUNTER — Telehealth (HOSPITAL_COMMUNITY): Payer: Self-pay | Admitting: Emergency Medicine

## 2022-04-17 NOTE — Telephone Encounter (Signed)
Unable to leave message. Sent a sms message  Marchia Bond RN Navigator Cardiac Imaging [facility] [phone] Office  [phone] Cell

## 2022-04-17 NOTE — Telephone Encounter (Signed)
Pt calling to cancel CCTA appt for tomororw 04/18/22, pt does not wish to r/s at this time  [facility] [phone] Office  [phone] Cell

## 2022-04-18 ENCOUNTER — Ambulatory Visit (HOSPITAL_COMMUNITY): Payer: No Typology Code available for payment source

## 2022-06-14 ENCOUNTER — Encounter (HOSPITAL_COMMUNITY): Payer: Self-pay

## 2022-06-24 ENCOUNTER — Other Ambulatory Visit: Payer: Self-pay | Admitting: Nephrology

## 2022-06-24 ENCOUNTER — Other Ambulatory Visit (HOSPITAL_COMMUNITY): Payer: Self-pay | Admitting: Nephrology

## 2022-06-24 DIAGNOSIS — R809 Proteinuria, unspecified: Secondary | ICD-10-CM

## 2022-06-26 NOTE — Progress Notes (Signed)
Attempted to reach patient to go over pre-procedure instructions 06/26/22 @ 13:20. Patient has a VM box that has not been set up. Will attempt to reach patient again tomorrow 06/27/22.

## 2022-06-27 NOTE — Progress Notes (Signed)
Spoke with patient 06/27/22 @ 9:50. Went over pre-procedure instructions to include the need to arrive at 7:30 for 8:30 procedure, need to be NPO after midnight on night prior to procedure, need to hold baby aspirin with last dose being 06/29/22, importance of continuing BP medication regiment and need for driver post-procedure. Patient verbalized understanding.

## 2022-07-02 ENCOUNTER — Other Ambulatory Visit: Payer: Self-pay | Admitting: Radiology

## 2022-07-02 DIAGNOSIS — Z9189 Other specified personal risk factors, not elsewhere classified: Secondary | ICD-10-CM

## 2022-07-03 ENCOUNTER — Ambulatory Visit
Admission: RE | Admit: 2022-07-03 | Discharge: 2022-07-03 | Disposition: A | Discharge status: Home / Self Care | Payer: No Typology Code available for payment source | Source: Ambulatory Visit | Attending: Nephrology | Admitting: Nephrology

## 2022-07-03 ENCOUNTER — Other Ambulatory Visit: Payer: Self-pay

## 2022-07-03 DIAGNOSIS — N189 Chronic kidney disease, unspecified: Secondary | ICD-10-CM | POA: Insufficient documentation

## 2022-07-03 DIAGNOSIS — R809 Proteinuria, unspecified: Secondary | ICD-10-CM | POA: Diagnosis present

## 2022-07-03 DIAGNOSIS — Z9189 Other specified personal risk factors, not elsewhere classified: Secondary | ICD-10-CM

## 2022-07-03 DIAGNOSIS — N028 Recurrent and persistent hematuria with other morphologic changes: Secondary | ICD-10-CM | POA: Insufficient documentation

## 2022-07-03 DIAGNOSIS — Z86718 Personal history of other venous thrombosis and embolism: Secondary | ICD-10-CM | POA: Diagnosis not present

## 2022-07-03 LAB — CBC
HCT: 35.8 % — ABNORMAL LOW (ref 36.0–46.0)
Hemoglobin: 11.7 g/dL — ABNORMAL LOW (ref 12.0–15.0)
MCH: 25.2 pg — ABNORMAL LOW (ref 26.0–34.0)
MCHC: 32.7 g/dL (ref 30.0–36.0)
MCV: 77 fL — ABNORMAL LOW (ref 80.0–100.0)
Platelets: 514 10*3/uL — ABNORMAL HIGH (ref 150–400)
RBC: 4.65 MIL/uL (ref 3.87–5.11)
RDW: 15.4 % (ref 11.5–15.5)
WBC: 14.3 10*3/uL — ABNORMAL HIGH (ref 4.0–10.5)
nRBC: 0 % (ref 0.0–0.2)

## 2022-07-03 LAB — PROTIME-INR
INR: 1 (ref 0.8–1.2)
Prothrombin Time: 12.9 seconds (ref 11.4–15.2)

## 2022-07-03 MED ORDER — FENTANYL CITRATE (PF) 100 MCG/2ML IJ SOLN
INTRAMUSCULAR | Status: AC
  Filled 2022-07-03: qty 2

## 2022-07-03 MED ORDER — SODIUM CHLORIDE 0.9 % IV SOLN: INTRAVENOUS | Status: DC

## 2022-07-03 MED ORDER — FENTANYL CITRATE (PF) 100 MCG/2ML IJ SOLN
INTRAMUSCULAR | Status: AC | PRN
  Administered 2022-07-03: 50 ug via INTRAVENOUS

## 2022-07-03 MED ORDER — MIDAZOLAM HCL 2 MG/2ML IJ SOLN
INTRAMUSCULAR | Status: AC | PRN
  Administered 2022-07-03: 1 mg via INTRAVENOUS

## 2022-07-03 MED ORDER — MIDAZOLAM HCL 2 MG/2ML IJ SOLN
INTRAMUSCULAR | Status: AC
  Filled 2022-07-03: qty 4

## 2022-07-03 MED ORDER — HYDROCODONE-ACETAMINOPHEN 5-325 MG PO TABS: 1.0000 | ORAL_TABLET | ORAL | Status: DC | PRN

## 2022-07-03 NOTE — Procedures (Signed)
Interventional Radiology Procedure Note  Procedure: US Guided Biopsy of left kidney  Complications: None  Estimated Blood Loss: < 10 mL  Findings: 1;6 G core biopsy of left renal cortex performed under US guidance.  Two core samples obtained and sent to Pathology.  Venetia Night. Kathlene Cote, M.D Pager:  [phone]

## 2022-07-03 NOTE — H&P (Signed)
Chief Complaint: Patient was seen in consultation today for non-focal renal biopsy   Referring Physician(s): Rosita Fire  Supervising Physician: Aletta Edouard  Patient Status: [facility] - Out-pt  History of Present Illness: Molly Wise is a 21 y.o. female with a medical history significant for chronic kidney disease and deep vein thrombosis. Per report from the patient and her mother (who is with her today), the patient had a renal [facility] last [facility]. This showed IgA nephropathy. 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   Interventional Radiology has been asked to evaluate this patient for an image-guided non-focal renal biopsy for further work up.   Past Medical History:  Diagnosis Date   Chronic kidney disease    DVT (deep venous thrombosis) ([place])     Past Surgical History:  Procedure Laterality Date   RENAL BIOPSY Left 2022    Allergies: Patient has no known allergies.  Medications: Prior to Admission medications   Medication Sig Start Date End Date Taking? Authorizing Provider  atorvastatin (LIPITOR) 20 MG tablet Take 20 mg by mouth daily. 12/19/21   [provider]  dapagliflozin propanediol (FARXIGA) 10 MG TABS tablet Take 10 mg by mouth daily.    [provider]  FEROSUL 325 (65 Fe) MG tablet Take 325 mg by mouth daily with breakfast. 03/28/21   [provider]  losartan (COZAAR) 25 MG tablet Take 12.5 mg by mouth daily. 08/15/21   [provider]  metoprolol tartrate (LOPRESSOR) 50 MG tablet Take 1 tablet by mouth once for procedure. 04/16/22   O'Neal, Cassie Freer, MD     Family History  Problem Relation Age of Onset   Graves' disease Mother    Diabetes Father    Cancer Maternal Grandmother    Cancer - Lung Paternal Grandmother        smoked   Cancer Paternal Grandmother    Diabetes Paternal Grandfather    Hyperlipidemia  Paternal Grandfather    Kidney disease Paternal Grandfather     Social History   Socioeconomic History   Marital status: Single    Spouse name: Not on file   Number of children: Not on file   Years of education: Not on file   Highest education level: Not on file  Occupational History   Occupation: Financial risk analyst  Tobacco Use   Smoking status: Never   Smokeless tobacco: Never  Vaping Use   Vaping Use: Never used  Substance and Sexual Activity   Alcohol use: Never   Drug use: Never   Sexual activity: Not Currently  Other Topics Concern   Not on file  Social History Narrative   In college. Lives in [place] at school, and with Mother when out of school.   Social Determinants of Health   Financial Resource Strain: Not on file  Food Insecurity: Not on file  Transportation Needs: Not on file  Physical Activity: Not on file  Stress: Not on file  Social Connections: Not on file    Review of Systems: A 12 point ROS discussed and pertinent positives are indicated in the HPI above.  All other systems are negative.  Review of Systems  Constitutional:  Negative for appetite change and fatigue.  Respiratory:  Negative for cough and shortness of breath.   Cardiovascular:  Positive for leg swelling. Negative for chest pain.  Gastrointestinal:  Negative for abdominal pain, diarrhea, nausea and vomiting.  Genitourinary:  Negative  for difficulty urinating, dysuria, flank pain and urgency.  Neurological:  Positive for headaches. Negative for dizziness.    Vital Signs: BP (!) 115/56   Pulse 72   Temp 99.1 F (37.3 C) (Oral)   Resp 14   Ht '5\' 6"'$  (1.676 m)   Wt 245 lb (111.1 kg)   SpO2 99%   BMI 39.54 kg/m   Physical Exam Constitutional:      General: She is not in acute distress.    Appearance: She is obese. She is not ill-appearing.  HENT:     Mouth/Throat:     Mouth: Mucous membranes are moist.     Pharynx: Oropharynx is clear.  Cardiovascular:     Rate and  Rhythm: Normal rate and regular rhythm.     Pulses: Normal pulses.     Heart sounds: Normal heart sounds.  Pulmonary:     Effort: Pulmonary effort is normal.     Breath sounds: Normal breath sounds.  Abdominal:     General: Bowel sounds are normal.     Palpations: Abdomen is soft.     Tenderness: There is no abdominal tenderness.  Musculoskeletal:     Right lower leg: Edema present.     Left lower leg: Edema present.  Skin:    General: Skin is warm and dry.  Neurological:     Mental Status: She is alert and oriented to person, place, and time.  Psychiatric:        Mood and Affect: Mood normal.        Behavior: Behavior normal.        Thought Content: Thought content normal.        Judgment: Judgment normal.     Imaging: No results found.  Labs:  CBC: Recent Labs    11/22/21 1537  WBC 8.2  HGB 11.8*  HCT 36.5  PLT 478*    COAGS: Recent Labs    07/03/22 0810  INR 1.0    BMP: Recent Labs    11/22/21 1537 04/05/22 0925  NA 136 139  K 4.1 4.7  CL 107 103  CO2 22 18*  GLUCOSE 87 86  BUN 12 15  CALCIUM 9.0 9.4  CREATININE 0.75 0.75  GFRNONAA >60  --     LIVER FUNCTION TESTS: Recent Labs    11/22/21 1537  BILITOT 0.5  AST 11*  ALT 7  ALKPHOS 63  PROT 6.3*  ALBUMIN 3.3*    TUMOR MARKERS: No results for input(s): "AFPTM", "CEA", "CA199", "CHROMGRNA" in the last 8760 hours.  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   Risks and benefits of this procedure were discussed with the patient and/or patient's family including, but not limited to bleeding, infection, damage to adjacent structures or low yield requiring additional tests.  All of the questions were answered and there is agreement to proceed. She has been NPO. Last dose of 81 mg aspirin was  Saturday 06/29/22  Consent signed and in chart.  Thank you for this interesting consult.  I greatly enjoyed meeting Molly Wise and look forward to participating in their care.  A copy of this report was sent to the requesting provider on this date.  Electronically Signed: Soyla Dryer, AGACNP-BC [phone] 07/03/2022, 8:47 AM   I spent a total of  30 Minutes   in face to face in clinical  consultation, greater than 50% of which was counseling/coordinating care for non-focal renal biopsy

## 2022-07-12 ENCOUNTER — Ambulatory Visit (HOSPITAL_COMMUNITY): Payer: No Typology Code available for payment source

## 2022-07-12 ENCOUNTER — Encounter (HOSPITAL_COMMUNITY): Payer: Self-pay

## 2022-07-29 ENCOUNTER — Encounter: Payer: Self-pay | Admitting: Nephrology

## 2022-07-29 LAB — SURGICAL PATHOLOGY

## 2022-09-24 ENCOUNTER — Encounter: Payer: Self-pay | Admitting: Hematology and Oncology

## 2022-09-24 NOTE — Telephone Encounter (Signed)
    09/24/22  9:24 AM Hey Dr. Chryl Heck. My kidney doctor wanted me to ask you do you think I should go back on Eliquis or any other from of blood clot prevention since I'm losing protein in me urine again.   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    Initial hypercoagulable work-up in her chart, she tested negative for factor V Leiden mutation, prothrombin gene mutation, lupus anticoagulant negative, negative beta-2 glycoprotein antibodies, mildly elevated anticardiolipin IgM antibodies and PNH flow was negative.   Labs from 7/18, with mildly elevated anticardiolipin antibodies, normal protein C activity, normal protein S activity, mildly elevated AT III. We have discussed that nephrotic range proteinuria and IgA glomerulonephritis may have created a hypercoagulable state due to an imbalance between naturally occurring procoagulants and anticoagulants.  She is here for FU, doing well overall.  Since last visit, her nephrotic syndrome has significantly improved, urinary protein amount has decreased significantly, she just had a follow-up with her nephrologist.  They are tapering her prednisone, she is currently on prednisone 10 mg daily. No concerning review of systems, she feels very well.  Physical examination without any concerns.  At this time since she has completed 6 months of anticoagulation and since she does not have nephrotic range proteinuria, I do believe she can discontinue anticoagulation.  We have however discussed about symptoms and signs of DVT/PE and the need to go to the hospital immediately with any such symptoms.  I have discussed about risk factors including stasis, trauma,  hypercoagulable conditions.  She was advised to avoid any form of birth control with estrogen to reduce her risk of early clotting.  She expressed understanding of all the recommendations and she will discontinue Eliquis as recommended.  She can return to clinic with [place] in a year"   Pt is scheduled to see MD 11/22/2022.  Nephrology labs are in [place]

## 2022-10-03 ENCOUNTER — Inpatient Hospital Stay
Payer: No Typology Code available for payment source | Attending: Hematology and Oncology | Admitting: Hematology and Oncology

## 2022-10-03 ENCOUNTER — Telehealth: Payer: Self-pay | Admitting: Hematology and Oncology

## 2022-10-03 DIAGNOSIS — I82431 Acute embolism and thrombosis of right popliteal vein: Secondary | ICD-10-CM | POA: Diagnosis not present

## 2022-10-03 NOTE — Telephone Encounter (Signed)
Called patient per CR to inform of telephone appointment 11/16. Patient notified.

## 2022-10-03 NOTE — Progress Notes (Signed)
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    Initial hypercoagulable work-up in her chart, she tested negative for factor V Leiden mutation, prothrombin gene mutation, lupus anticoagulant negative, negative beta-2 glycoprotein antibodies, mildly elevated anticardiolipin IgM antibodies and PNH flow was negative.   Labs from 7/18, with mildly elevated anticardiolipin antibodies, normal protein C activity, normal protein S activity, mildly elevated AT III. We have discussed that nephrotic range proteinuria and IgA glomerulonephritis may have created a hypercoagulable state due to an imbalance between naturally occurring procoagulants and anticoagulants.  I just spoke to Dr Dorris Fetch from [facility], he was concerned about the increase in proteinuria again and wondered if there is a role for anticoagulation since her original blood clot happened in the setting of nephrotic range proteinuria, glomerulonephritis and there is no other hypercoagulable work-up that could explain the reason for the blood clot.  I have explained this to Molly Wise that this is a reasonable recommendation.  She is comfortable with the considering anticoagulation.  We have discussed about prophylactic dose Eliquis 2.5 mg p.o. twice daily.  She understands the risks of bleeding with anticoagulation on board.  I have sent the prescription to the pharmacy of her choice.  She will start this and return to clinic in about 6 months.  HISTORY OF PRESENTING ILLNESS:   Molly Wise 21 y.o. female is here because of popliteal vein  DVT  This is a very pleasant 21 year old female patient with no significant past medical history, recently diagnosed with IgA glomerulonephritis and currently on prednisone, also was found to have right lower extremity DVT and pulmonary embolism referred to hematology for anticoagulation recommendations. During her initial visit, hypercoagulable work up was neg, mildly elevated aCL antibodies and antithrombin activity.  She is here for telephone follow-up.  We got a message saying that her nephrologist was wondering if she would she would be restarted on anticoagulation.  She denies any complaints suggestive of any acute DVT/PE however given the increase in proteinuria, she was wondering about this.  No other concerning review of systems on our telephone visit today  MEDICAL HISTORY:  Past Medical History:  Diagnosis Date   Chronic kidney disease    DVT (deep venous thrombosis) ([place])     SURGICAL HISTORY: Past Surgical History:  Procedure Laterality Date   RENAL BIOPSY Left 2022    SOCIAL HISTORY: Social History   Socioeconomic History   Marital status: Single    Spouse name: Not on file   Number of children: Not on file   Years of education: Not on file   Highest education level: Not on file  Occupational History   Occupation: Financial risk analyst  Tobacco Use   Smoking status: Never   Smokeless tobacco: Never  Vaping Use   Vaping Use: Never used  Substance and Sexual Activity   Alcohol use: Never   Drug use: Never   Sexual activity: Not Currently  Other Topics Concern   Not on file  Social History Narrative   In college. Lives in [place] at school, and with Mother when out of school.   Social Determinants of Health   Financial  Resource Strain: Not on file  Food Insecurity: Not on file  Transportation Needs: Not on file  Physical Activity: Not on file  Stress: Not on file  Social Connections: Not on file  Intimate Partner Violence: Not on file    FAMILY  HISTORY: Family History  Problem Relation Age of Onset   Graves' disease Mother    Diabetes Father    Cancer Maternal Grandmother    Cancer - Lung Paternal Grandmother        smoked   Cancer Paternal Grandmother    Diabetes Paternal Grandfather    Hyperlipidemia Paternal Grandfather    Kidney disease Paternal Grandfather     ALLERGIES:  has No Known Allergies.  MEDICATIONS:  Current Outpatient Medications  Medication Sig Dispense Refill   aspirin EC 81 MG tablet Take 81 mg by mouth daily. Swallow whole.     atorvastatin (LIPITOR) 20 MG tablet Take 20 mg by mouth daily. (Patient not taking: Reported on 07/03/2022)     dapagliflozin propanediol (FARXIGA) 10 MG TABS tablet Take 10 mg by mouth daily. (Patient not taking: Reported on 07/03/2022)     Empagliflozin (JARDIANCE PO) Take 10 mg by mouth daily.     FEROSUL 325 (65 Fe) MG tablet Take 325 mg by mouth daily with breakfast.     losartan (COZAAR) 25 MG tablet Take 50 mg by mouth 2 (two) times daily.     losartan (COZAAR) 50 MG tablet Take 50 mg by mouth 2 (two) times daily.     metoprolol tartrate (LOPRESSOR) 50 MG tablet Take 1 tablet by mouth once for procedure. (Patient not taking: Reported on 07/03/2022) 90 tablet 3   mycophenolate (CELLCEPT) 500 MG tablet Take 500 mg by mouth 2 (two) times daily.     predniSONE (DELTASONE) 50 MG tablet Take 60 mg by mouth daily with breakfast. Taper dose as ordered     No current facility-administered medications for this visit.    PHYSICAL EXAMINATION: ECOG PERFORMANCE STATUS: 0  There were no vitals filed for this visit.   There were no vitals filed for this visit.  Physical examination not done, telephone visit  LABORATORY DATA:  I have reviewed the data as listed Lab Results  Component Value Date   WBC 14.3 (H) 07/03/2022   HGB 11.7 (L) 07/03/2022   HCT 35.8 (L) 07/03/2022   MCV 77.0 (L) 07/03/2022   PLT 514 (H) 07/03/2022     Chemistry      Component Value Date/Time   NA  139 04/05/2022 0925   K 4.7 04/05/2022 0925   CL 103 04/05/2022 0925   CO2 18 (L) 04/05/2022 0925   BUN 15 04/05/2022 0925   CREATININE 0.75 04/05/2022 0925   CREATININE 0.75 11/22/2021 1537      Component Value Date/Time   CALCIUM 9.4 04/05/2022 0925   ALKPHOS 63 11/22/2021 1537   AST 11 (L) 11/22/2021 1537   ALT 7 11/22/2021 1537   BILITOT 0.5 11/22/2021 1537       RADIOGRAPHIC STUDIES: I have personally reviewed the radiological images as listed and agreed with the findings in the report. No results found.  Summary:  RIGHT:  - Findings consistent with acute deep vein thrombosis involving the right  popliteal vein, right posterior tibial veins, and right peroneal veins.  - There is no evidence of superficial venous thrombosis.     - No cystic structure found in the popliteal fossa.     LEFT:  - No evidence of common  femoral vein obstruction.   Factor V leiden Neg Prothrombin gene neg Flow for PNH neg LA neg Beta 2 GP antibodies neg Mildly elevated Ig M ACL ab Ferritin of 32. Normal protein C and S activity Mildly elevated AT III activity  Outside labs reviewed,  All questions were answered. The patient knows to call the clinic with any problems, questions or concerns. I connected with  Molly Wise on 10/04/22 by a telephone application and verified that I am speaking with the correct person using two identifiers.   I discussed the limitations of evaluation and management by telemedicine. The patient expressed understanding and agreed to proceed.  Please review above-mentioned assessment and plan for detailed discussion from today.  Total time spent: 20 min including history, review of records, counseling, coordination of care between Dr. Dorris Fetch and documentation    Benay Pike, MD 10/03/2022 4:03 PM

## 2022-10-04 ENCOUNTER — Encounter: Payer: Self-pay | Admitting: Hematology and Oncology

## 2022-10-04 MED ORDER — APIXABAN 2.5 MG PO TABS: 2.5000 mg | ORAL_TABLET | Freq: Two times a day (BID) | ORAL | 3 refills | Status: DC

## 2022-11-19 ENCOUNTER — Telehealth: Payer: Self-pay | Admitting: Hematology and Oncology

## 2022-11-19 NOTE — Telephone Encounter (Signed)
Patient called to r/s 1/5 appointment. R/s and patient notified.

## 2022-11-22 ENCOUNTER — Ambulatory Visit: Payer: No Typology Code available for payment source | Admitting: Hematology and Oncology

## 2022-12-11 ENCOUNTER — Ambulatory Visit: Payer: No Typology Code available for payment source | Admitting: Hematology and Oncology

## 2022-12-19 ENCOUNTER — Encounter: Payer: Self-pay | Admitting: Hematology and Oncology

## 2022-12-19 ENCOUNTER — Other Ambulatory Visit: Payer: Self-pay | Admitting: *Deleted

## 2022-12-19 ENCOUNTER — Inpatient Hospital Stay: Payer: No Typology Code available for payment source

## 2022-12-19 ENCOUNTER — Inpatient Hospital Stay
Payer: No Typology Code available for payment source | Attending: Hematology and Oncology | Admitting: Hematology and Oncology

## 2022-12-19 VITALS — BP 146/83 | HR 81 | Temp 97.7°F | Resp 14 | Ht 66.0 in | Wt 248.8 lb

## 2022-12-19 DIAGNOSIS — Z7901 Long term (current) use of anticoagulants: Secondary | ICD-10-CM | POA: Diagnosis not present

## 2022-12-19 DIAGNOSIS — D8989 Other specified disorders involving the immune mechanism, not elsewhere classified: Secondary | ICD-10-CM

## 2022-12-19 DIAGNOSIS — Z7982 Long term (current) use of aspirin: Secondary | ICD-10-CM | POA: Diagnosis not present

## 2022-12-19 DIAGNOSIS — Z86718 Personal history of other venous thrombosis and embolism: Secondary | ICD-10-CM | POA: Insufficient documentation

## 2022-12-19 LAB — COMPREHENSIVE METABOLIC PANEL
ALT: 9 U/L (ref 0–44)
AST: 13 U/L — ABNORMAL LOW (ref 15–41)
Albumin: 3.1 g/dL — ABNORMAL LOW (ref 3.5–5.0)
Alkaline Phosphatase: 61 U/L (ref 38–126)
Anion gap: 6 (ref 5–15)
BUN: 13 mg/dL (ref 6–20)
CO2: 24 mmol/L (ref 22–32)
Calcium: 8.7 mg/dL — ABNORMAL LOW (ref 8.9–10.3)
Chloride: 109 mmol/L (ref 98–111)
Creatinine, Ser: 0.74 mg/dL (ref 0.44–1.00)
GFR, Estimated: >60 mL/min (ref >60)
Glucose, Bld: 88 mg/dL (ref 70–99)
Potassium: 4.1 mmol/L (ref 3.5–5.1)
Sodium: 139 mmol/L (ref 135–145)
Total Bilirubin: 0.3 mg/dL (ref 0.3–1.2)
Total Protein: 6.1 g/dL — ABNORMAL LOW (ref 6.5–8.1)

## 2022-12-19 LAB — CBC WITH DIFFERENTIAL/PLATELET
Abs Immature Granulocytes: 0.02 10*3/uL (ref 0.00–0.07)
Basophils Absolute: 0.1 10*3/uL (ref 0.0–0.1)
Basophils Relative: 1 %
Eosinophils Absolute: 0 10*3/uL (ref 0.0–0.5)
Eosinophils Relative: 0 %
HCT: 37.6 % (ref 36.0–46.0)
Hemoglobin: 12.1 g/dL (ref 12.0–15.0)
Immature Granulocytes: 0 %
Lymphocytes Relative: 37 %
Lymphs Abs: 2.6 10*3/uL (ref 0.7–4.0)
MCH: 25.6 pg — ABNORMAL LOW (ref 26.0–34.0)
MCHC: 32.2 g/dL (ref 30.0–36.0)
MCV: 79.5 fL — ABNORMAL LOW (ref 80.0–100.0)
Monocytes Absolute: 0.5 10*3/uL (ref 0.1–1.0)
Monocytes Relative: 7 %
Neutro Abs: 3.9 10*3/uL (ref 1.7–7.7)
Neutrophils Relative %: 55 %
Platelets: 250 10*3/uL (ref 150–400)
RBC: 4.73 MIL/uL (ref 3.87–5.11)
RDW: 14.8 % (ref 11.5–15.5)
WBC: 7.1 10*3/uL (ref 4.0–10.5)
nRBC: 0 % (ref 0.0–0.2)

## 2022-12-19 NOTE — Progress Notes (Signed)
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  Initial hypercoagulable work-up in her chart, she tested negative for factor V Leiden mutation, prothrombin gene mutation, lupus anticoagulant negative, negative beta-2 glycoprotein antibodies, mildly elevated anticardiolipin IgM antibodies and PNH flow was negative.    Labs from 7/18, with mildly elevated anticardiolipin antibodies, normal protein C activity, normal protein S activity, mildly elevated AT III. We have discussed that nephrotic range proteinuria and IgA glomerulonephritis may have created a hypercoagulable state due to an imbalance between naturally occurring procoagulants and anticoagulants.  I spoke to Dr Dorris Fetch yesterday, he mentioned that the proteinuria has improved and wondered if we can remove the anticoagulation and consider aspirin. He also mentioned that the kidney biopsy had some dominant kappa light chains and wondered if we can do some work up for monoclonal gammopathy.  Apparently patient never started the Eliquis and is currently not on it. She instead is on baby aspirin 81 mg po daily. She is otherwise doing well and is willing to proceed with MGUS work up given kidney biopsy results. I ordered SPEP, kappa lambda light chain ratio, UPEP and immunoglobulin levels today.  I will call her in 10 days to review the results.  Mom was on the phone at the time of her visit.  She is agreeable to the plan.  HISTORY OF PRESENTING ILLNESS:   Molly Wise 22 y.o. female is here because of popliteal vein DVT  This is a very pleasant 22 year old female patient with no  significant past medical history, recently diagnosed with IgA glomerulonephritis and currently on prednisone, also was found to have right lower extremity DVT and pulmonary embolism referred to hematology for anticoagulation recommendations. During her initial visit, hypercoagulable work up was neg, mildly elevated aCL antibodies and antithrombin activity.  She is here for follow up. She last saw [place] November. We discussed about anticoagulation last visit given increase in proteinuria but apparently she never started it because her proteinuria has resolved. Most recently however she went back to [facility], had a relapse Ig A nephropathy, she is now on cellcept, prednisone 10 mg daily and Jardiance. She is doing well.  She denies any complaints today concerning for DVT/PE.  Rest of the pertinent 10 point ROS reviewed and negative   MEDICAL HISTORY:  Past Medical History:  Diagnosis Date   Chronic kidney disease    DVT (deep venous thrombosis) (HCC)     SURGICAL HISTORY: Past Surgical History:  Procedure Laterality Date   RENAL BIOPSY Left 2022    SOCIAL HISTORY: Social History   Socioeconomic History   Marital status: Single    Spouse name: Not on file   Number of children: Not on file   Years of education: Not on file   Highest education level: Not on file  Occupational History   Occupation: Financial risk analyst  Tobacco Use   Smoking status: Never   Smokeless tobacco: Never  Vaping Use   Vaping Use: Never used  Substance and Sexual Activity   Alcohol use: Never   Drug use: Never   Sexual activity: Not Currently  Other Topics Concern   Not on file  Social History Narrative   In college. Lives in [place] at school, and with Mother when out of school.   Social Determinants of Health   Financial Resource Strain: Not on file  Food Insecurity: Not on file  Transportation Needs: Not on file  Physical Activity: Not on file  Stress: Not on file  Social Connections: Not on file   Intimate Partner Violence: Not on file    FAMILY HISTORY: Family History  Problem Relation Age of Onset   Graves' disease Mother    Diabetes Father    Cancer Maternal Grandmother    Cancer - Lung Paternal Grandmother        smoked   Cancer Paternal Grandmother    Diabetes Paternal Grandfather    Hyperlipidemia Paternal Grandfather    Kidney disease Paternal Grandfather     ALLERGIES:  has No Known Allergies.  MEDICATIONS:  Current Outpatient Medications  Medication Sig Dispense Refill   apixaban (ELIQUIS) 2.5 MG TABS tablet Take 1 tablet (2.5 mg total) by mouth 2 (two) times daily. 60 tablet 3   atorvastatin (LIPITOR) 20 MG tablet Take 20 mg by mouth daily. (Patient not taking: Reported on 07/03/2022)     dapagliflozin propanediol (FARXIGA) 10 MG TABS tablet Take 10 mg by mouth daily. (Patient not taking: Reported on 07/03/2022)     Empagliflozin (JARDIANCE PO) Take 10 mg by mouth daily.     FEROSUL 325 (65 Fe) MG tablet Take 325 mg by mouth daily with breakfast.     losartan (COZAAR) 25 MG tablet Take 50 mg by mouth 2 (two) times daily.     losartan (COZAAR) 50 MG tablet Take 50 mg by mouth 2 (two) times daily.     metoprolol tartrate (LOPRESSOR) 50 MG tablet Take 1 tablet by mouth once for procedure. (Patient not taking: Reported on 07/03/2022) 90 tablet 3   mycophenolate (CELLCEPT) 500 MG tablet Take 500 mg by mouth 2 (two) times daily.     predniSONE (DELTASONE) 50 MG tablet Take 60 mg by mouth daily with breakfast. Taper dose as ordered     No current facility-administered medications for this visit.    PHYSICAL EXAMINATION: ECOG PERFORMANCE STATUS: 0  There were no vitals filed for this visit.   There were no vitals filed for this visit.  Physical Exam Constitutional:      Appearance: Normal appearance.  Cardiovascular:     Rate and Rhythm: Normal rate and regular rhythm.     Pulses: Normal pulses.     Heart sounds: Normal heart sounds.  Pulmonary:     Effort:  Pulmonary effort is normal.     Breath sounds: Normal breath sounds.  Abdominal:     General: Abdomen is flat.     Palpations: Abdomen is soft.  Musculoskeletal:        General: No swelling or tenderness. Normal range of motion.     Cervical back: Normal range of motion. No rigidity.  Lymphadenopathy:     Cervical: No cervical adenopathy.  Skin:    General: Skin is warm and dry.  Neurological:     General: No focal deficit present.     Mental Status: She is alert.     LABORATORY DATA:  I have reviewed the data as listed Lab Results  Component Value Date   WBC 14.3 (H) 07/03/2022   HGB 11.7 (L) 07/03/2022   HCT 35.8 (L) 07/03/2022   MCV 77.0 (L) 07/03/2022   PLT 514 (H) 07/03/2022  Chemistry      Component Value Date/Time   NA 139 04/05/2022 0925   K 4.7 04/05/2022 0925   CL 103 04/05/2022 0925   CO2 18 (L) 04/05/2022 0925   BUN 15 04/05/2022 0925   CREATININE 0.75 04/05/2022 0925   CREATININE 0.75 11/22/2021 1537      Component Value Date/Time   CALCIUM 9.4 04/05/2022 0925   ALKPHOS 63 11/22/2021 1537   AST 11 (L) 11/22/2021 1537   ALT 7 11/22/2021 1537   BILITOT 0.5 11/22/2021 1537       RADIOGRAPHIC STUDIES: I have personally reviewed the radiological images as listed and agreed with the findings in the report. No results found.  Summary:  RIGHT:  - Findings consistent with acute deep vein thrombosis involving the right  popliteal vein, right posterior tibial veins, and right peroneal veins.  - There is no evidence of superficial venous thrombosis.     - No cystic structure found in the popliteal fossa.     LEFT:  - No evidence of common femoral vein obstruction.   Factor V leiden Neg Prothrombin gene neg Flow for PNH neg LA neg Beta 2 GP antibodies neg Mildly elevated Ig M ACL ab Ferritin of 32. Normal protein C and S activity Mildly elevated AT III activity  Outside labs reviewed,  I spoke to Dr. Dorris Fetch yesterday about recommendations.   We will proceed with MGUS workup given some abnormal kidney biopsy results. I will call her back in about 10 days to review the lab results from today.  In the interim she was recommended to continue aspirin daily.  Total time spent: 30 minutes    Benay Pike, MD 12/19/2022 8:31 AM

## 2022-12-20 LAB — IGG, IGA, IGM
IgA: 147 mg/dL (ref 87–352)
IgG (Immunoglobin G), Serum: 534 mg/dL — ABNORMAL LOW (ref 586–1602)
IgM (Immunoglobulin M), Srm: 132 mg/dL (ref 26–217)

## 2022-12-20 LAB — KAPPA/LAMBDA LIGHT CHAINS
Kappa free light chain: 27.4 mg/L — ABNORMAL HIGH (ref 3.3–19.4)
Kappa, lambda light chain ratio: 1.53 (ref 0.26–1.65)
Lambda free light chains: 17.9 mg/L (ref 5.7–26.3)

## 2022-12-23 LAB — PROTEIN ELECTROPHORESIS, SERUM, WITH REFLEX
A/G Ratio: 1 (ref 0.7–1.7)
Albumin ELP: 2.7 g/dL — ABNORMAL LOW (ref 2.9–4.4)
Alpha-1-Globulin: 0.2 g/dL (ref 0.0–0.4)
Alpha-2-Globulin: 1 g/dL (ref 0.4–1.0)
Beta Globulin: 1 g/dL (ref 0.7–1.3)
Gamma Globulin: 0.5 g/dL (ref 0.4–1.8)
Globulin, Total: 2.7 g/dL (ref 2.2–3.9)
Total Protein ELP: 5.4 g/dL — ABNORMAL LOW (ref 6.0–8.5)

## 2022-12-25 ENCOUNTER — Other Ambulatory Visit: Payer: Self-pay

## 2022-12-25 DIAGNOSIS — Z86718 Personal history of other venous thrombosis and embolism: Secondary | ICD-10-CM | POA: Diagnosis not present

## 2022-12-25 DIAGNOSIS — D8989 Other specified disorders involving the immune mechanism, not elsewhere classified: Secondary | ICD-10-CM

## 2022-12-30 ENCOUNTER — Inpatient Hospital Stay (HOSPITAL_BASED_OUTPATIENT_CLINIC_OR_DEPARTMENT_OTHER): Payer: No Typology Code available for payment source | Admitting: Hematology and Oncology

## 2022-12-30 ENCOUNTER — Other Ambulatory Visit: Payer: Self-pay | Admitting: Hematology and Oncology

## 2022-12-30 DIAGNOSIS — I82431 Acute embolism and thrombosis of right popliteal vein: Secondary | ICD-10-CM | POA: Diagnosis not present

## 2022-12-30 DIAGNOSIS — D8989 Other specified disorders involving the immune mechanism, not elsewhere classified: Secondary | ICD-10-CM

## 2022-12-30 LAB — UPEP/TP, 24-HR URINE
Albumin, U: 68.5 %
Alpha 1, Urine: 6.4 %
Alpha 2, Urine: 8.5 %
Beta, Urine: 12.2 %
Gamma Globulin, Urine: 4.4 %
Total Protein, Urine-Ur/day: 5722 mg/24 hr — ABNORMAL HIGH (ref 30–150)
Total Protein, Urine: 224.4 mg/dL
Total Volume: 2550

## 2022-12-30 NOTE — Progress Notes (Signed)
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  Initial hypercoagulable work-up in her chart, she tested negative for factor V Leiden mutation, prothrombin gene mutation, lupus anticoagulant negative, negative beta-2 glycoprotein antibodies, mildly elevated anticardiolipin IgM antibodies and PNH flow was negative.    Labs from 7/18, with mildly elevated anticardiolipin antibodies, normal protein C activity, normal protein S activity, mildly elevated AT III. We have discussed that nephrotic range proteinuria and IgA glomerulonephritis may have created a hypercoagulable state due to an imbalance between naturally occurring procoagulants and anticoagulants.  Dr Dorris Fetch also mentioned that the kidney biopsy had some dominant kappa light chains and wondered if we can do some work up for monoclonal gammopathy. When she came for last visit, we ordered SPEP, Kappa/lambda  light chain ratio, immunoglobulin levels and UPEP.  Prep negative, immunoglobulin levels without any concern.  Kappa lambda ratio as well is normal, UPEP pending at this time. At this time there is no evidence of monoclonal gammopathy hence have advised her to continue aspirin and return to clinic to see me in July.  Have rescheduled her appointment from May to July and she is aware of the new date and time.  We can consider labs after the visit if we deem this as necessary.  She is agreeable to this plan.   HISTORY OF PRESENTING ILLNESS:   Lauranne JAKEIA STRUNK 22 y.o. female is here because of popliteal vein DVT  This is a very pleasant 22 year old female patient with no significant past  medical history, recently diagnosed with IgA glomerulonephritis and currently on prednisone, also was found to have right lower extremity DVT and pulmonary embolism referred to hematology for anticoagulation recommendations. During her initial visit, hypercoagulable work up was neg, mildly elevated aCL antibodies and antithrombin activity.  She is here for phone follow-up.  She is doing quite well.  She denies any major complaints today.  She is compliant with aspirin.  No concerning review of systems reported.   MEDICAL HISTORY:  Past Medical History:  Diagnosis Date   Chronic kidney disease    DVT (deep venous thrombosis) (HCC)     SURGICAL HISTORY: Past Surgical History:  Procedure Laterality Date   RENAL BIOPSY Left 2022    SOCIAL HISTORY: Social History   Socioeconomic History   Marital status: Single    Spouse name: Not on file   Number of children: Not on file   Years of education: Not on file   Highest education level: Not on file  Occupational History   Occupation: Financial risk analyst  Tobacco Use   Smoking status: Never   Smokeless tobacco: Never  Vaping Use   Vaping Use: Never used  Substance and Sexual Activity   Alcohol use: Never   Drug use: Never   Sexual activity: Not Currently  Other Topics Concern   Not on file  Social History Narrative   In college. Lives in [place] at school, and with Mother when out of school.   Social Determinants of Health   Financial Resource Strain: Not on file  Food Insecurity: Not on file  Transportation Needs: Not on file  Physical Activity: Not on file  Stress: Not on file  Social Connections: Not on file  Intimate Partner Violence: Not on file    FAMILY HISTORY: Family History  Problem Relation Age of Onset   Graves' disease Mother    Diabetes Father    Cancer Maternal Grandmother    Cancer - Lung Paternal Grandmother        smoked   Cancer Paternal Grandmother    Diabetes Paternal Grandfather     Hyperlipidemia Paternal Grandfather    Kidney disease Paternal Grandfather     ALLERGIES:  has No Known Allergies.  MEDICATIONS:  Current Outpatient Medications  Medication Sig Dispense Refill   apixaban (ELIQUIS) 2.5 MG TABS tablet Take 1 tablet (2.5 mg total) by mouth 2 (two) times daily. 60 tablet 3   atorvastatin (LIPITOR) 20 MG tablet Take 20 mg by mouth daily. (Patient not taking: Reported on 07/03/2022)     dapagliflozin propanediol (FARXIGA) 10 MG TABS tablet Take 10 mg by mouth daily. (Patient not taking: Reported on 07/03/2022)     Empagliflozin (JARDIANCE PO) Take 10 mg by mouth daily.     FEROSUL 325 (65 Fe) MG tablet Take 325 mg by mouth daily with breakfast.     losartan (COZAAR) 25 MG tablet Take 50 mg by mouth 2 (two) times daily.     losartan (COZAAR) 50 MG tablet Take 50 mg by mouth 2 (two) times daily.     metoprolol tartrate (LOPRESSOR) 50 MG tablet Take 1 tablet by mouth once for procedure. (Patient not taking: Reported on 07/03/2022) 90 tablet 3   mycophenolate (CELLCEPT) 500 MG tablet Take 500 mg by mouth 2 (two) times daily.     predniSONE (DELTASONE) 50 MG tablet Take 60 mg by mouth daily with breakfast. Taper dose as ordered     No current facility-administered medications for this visit.    PHYSICAL EXAMINATION: ECOG PERFORMANCE STATUS: 0  There were no vitals filed for this visit.   There were no vitals filed for this visit.  Physical exam not done, telephone visit  LABORATORY DATA:  I have reviewed the data as listed Lab Results  Component Value Date   WBC 7.1 12/19/2022   HGB 12.1 12/19/2022   HCT 37.6 12/19/2022   MCV 79.5 (L) 12/19/2022   PLT 250 12/19/2022     Chemistry      Component Value Date/Time   NA 139 12/19/2022 0936   NA 139 04/05/2022 0925   K 4.1 12/19/2022 0936   CL 109 12/19/2022 0936   CO2 24 12/19/2022 0936   BUN 13 12/19/2022 0936   BUN 15 04/05/2022 0925   CREATININE 0.74 12/19/2022 0936   CREATININE 0.75 11/22/2021  1537      Component Value Date/Time   CALCIUM 8.7 (L) 12/19/2022 0936   ALKPHOS 61 12/19/2022 0936   AST 13 (L) 12/19/2022 0936   AST 11 (L) 11/22/2021 1537   ALT 9 12/19/2022 0936   ALT 7 11/22/2021 1537   BILITOT 0.3 12/19/2022 0936   BILITOT 0.5 11/22/2021 1537       RADIOGRAPHIC STUDIES: I have personally reviewed the radiological images as listed and agreed with the findings in the report. No results found.  Summary:  RIGHT:  - Findings consistent with acute deep vein thrombosis involving the right  popliteal vein, right posterior tibial veins, and right peroneal veins.  - There is no evidence of superficial venous thrombosis.     - No cystic structure found in the popliteal fossa.  LEFT:  - No evidence of common femoral vein obstruction.   Factor V leiden Neg Prothrombin gene neg Flow for PNH neg LA neg Beta 2 GP antibodies neg Mildly elevated Ig M ACL ab Ferritin of 32. Normal protein C and S activity Mildly elevated AT III activity  Labs reviewed  I connected with  Kimmora ROTHA LAUR on 12/30/22 by a telephone application and verified that I am speaking with the correct person using two identifiers.   I discussed the limitations of evaluation and management by telemedicine. The patient expressed understanding and agreed to proceed.  Total time spent: 10 minutes.   Benay Pike, MD 12/30/2022 12:46 PM

## 2023-01-01 ENCOUNTER — Telehealth: Payer: Self-pay

## 2023-01-01 NOTE — Telephone Encounter (Signed)
-----   Message from Molly Pike, MD sent at 01/01/2023  1:03 PM EST ----- No M protein in urine, but total protein appears to be higher than before. I will send a message to her [facility] nephrologist. Please ask patient to also contact their office, her total protein is 5.7 gm/24 hrs.

## 2023-01-01 NOTE — Telephone Encounter (Signed)
Called pt to give information and recommendation. She verbalized thanks and understanding.

## 2023-04-04 ENCOUNTER — Ambulatory Visit: Payer: No Typology Code available for payment source | Admitting: Hematology and Oncology

## 2023-04-24 ENCOUNTER — Emergency Department (HOSPITAL_COMMUNITY): Payer: No Typology Code available for payment source

## 2023-04-24 ENCOUNTER — Encounter (HOSPITAL_COMMUNITY): Payer: Self-pay

## 2023-04-24 ENCOUNTER — Emergency Department (HOSPITAL_COMMUNITY)
Admission: EM | Admit: 2023-04-24 | Discharge: 2023-04-24 | Disposition: A | Discharge status: Home / Self Care | Payer: No Typology Code available for payment source | Attending: Emergency Medicine | Admitting: Emergency Medicine

## 2023-04-24 DIAGNOSIS — I676 Nonpyogenic thrombosis of intracranial venous system: Secondary | ICD-10-CM | POA: Insufficient documentation

## 2023-04-24 DIAGNOSIS — Z7901 Long term (current) use of anticoagulants: Secondary | ICD-10-CM | POA: Insufficient documentation

## 2023-04-24 DIAGNOSIS — G08 Intracranial and intraspinal phlebitis and thrombophlebitis: Secondary | ICD-10-CM

## 2023-04-24 DIAGNOSIS — R200 Anesthesia of skin: Secondary | ICD-10-CM | POA: Diagnosis present

## 2023-04-24 LAB — I-STAT CHEM 8, ED
BUN: 9 mg/dL (ref 6–20)
Calcium, Ion: 1.14 mmol/L — ABNORMAL LOW (ref 1.15–1.40)
Chloride: 107 mmol/L (ref 98–111)
Creatinine, Ser: 0.8 mg/dL (ref 0.44–1.00)
Glucose, Bld: 100 mg/dL — ABNORMAL HIGH (ref 70–99)
HCT: 35 % — ABNORMAL LOW (ref 36.0–46.0)
Hemoglobin: 11.9 g/dL — ABNORMAL LOW (ref 12.0–15.0)
Potassium: 3.5 mmol/L (ref 3.5–5.1)
Sodium: 141 mmol/L (ref 135–145)
TCO2: 25 mmol/L (ref 22–32)

## 2023-04-24 MED ORDER — APIXABAN 5 MG PO TABS
10.0000 mg | ORAL_TABLET | Freq: Once | ORAL | Status: AC
  Administered 2023-04-24: 10 mg via ORAL
  Filled 2023-04-24: qty 2

## 2023-04-24 MED ORDER — IOHEXOL 350 MG/ML SOLN
75.0000 mL | Freq: Once | INTRAVENOUS | Status: AC | PRN
  Administered 2023-04-24: 75 mL via INTRAVENOUS

## 2023-04-24 NOTE — ED Triage Notes (Signed)
Right sided facial numbness and right sided numbness from arm to thigh started at 1000. All symptoms resolved now. Alert and oriented x 4. No hx of stroke but recent cerebral thrombosis diagnosis.

## 2023-04-24 NOTE — ED Provider Notes (Signed)
EMERGENCY DEPARTMENT AT [facility] Provider Note   CSN: [id number] Arrival date & time: 04/24/23  1057     History  Chief Complaint  Patient presents with   Numbness    Molly Wise is a 22 y.o. female with a history of dural venous sinus thrombosis, pulmonary embolism, and IgA glomerulonephritis who presents to the ED via EMS for right sided facial drooping with right sided numbness to the face, arm, and leg. Symptoms began about 10 AM this morning and resolved several minutes after onset.   Patient reports that she was discharged home from the hospital 2 days ago for similar symptoms in [place].  She was diagnosed with a dural venous sinus thrombus and put on Heparin then bridged to Lovenox.  She was discharged home with Eliquis 10 mg twice a day.  Patient reports taking her medication as directed.  She has a follow-up with neurology scheduled for weeks.  No other complaints or concerns at this time.    Home Medications Prior to Admission medications   Medication Sig Start Date End Date Taking? Authorizing Provider  apixaban (ELIQUIS) 2.5 MG TABS tablet Take 1 tablet (2.5 mg total) by mouth 2 (two) times daily. 10/04/22   Rachel Moulds, MD  atorvastatin (LIPITOR) 20 MG tablet Take 20 mg by mouth daily. Patient not taking: Reported on 07/03/2022 12/19/21   [provider]  dapagliflozin propanediol (FARXIGA) 10 MG TABS tablet Take 10 mg by mouth daily. Patient not taking: Reported on 07/03/2022    [provider]  Empagliflozin (JARDIANCE PO) Take 10 mg by mouth daily.    [provider]  FEROSUL 325 (65 Fe) MG tablet Take 325 mg by mouth daily with breakfast. 03/28/21   [provider]  losartan (COZAAR) 25 MG tablet Take 50 mg by mouth 2 (two) times daily. 08/15/21   [provider]  losartan (COZAAR) 50 MG tablet Take 50 mg by mouth 2 (two) times daily.    [provider]  metoprolol tartrate (LOPRESSOR)  50 MG tablet Take 1 tablet by mouth once for procedure. Patient not taking: Reported on 07/03/2022 04/16/22   Sande Rives, MD  mycophenolate (CELLCEPT) 500 MG tablet Take 500 mg by mouth 2 (two) times daily.    [provider]  predniSONE (DELTASONE) 50 MG tablet Take 60 mg by mouth daily with breakfast. Taper dose as ordered    [provider]      Allergies    Patient has no known allergies.    Review of Systems   Review of Systems  Neurological:  Positive for numbness.       Right sided  All other systems reviewed and are negative.   Physical Exam Updated Vital Signs BP 123/70   Pulse 76   Temp 98.7 F (37.1 C) (Oral)   Resp 14   Ht 5\' 6"  (1.676 m)   Wt 111.1 kg   SpO2 100%   BMI 39.54 kg/m  Physical Exam Vitals and nursing note reviewed.  Constitutional:      Appearance: Normal appearance.  HENT:     Head: Normocephalic and atraumatic.     Mouth/Throat:     Mouth: Mucous membranes are moist.  Eyes:     Conjunctiva/sclera: Conjunctivae normal.     Pupils: Pupils are equal, round, and reactive to light.  Cardiovascular:     Rate and Rhythm: Normal rate and regular rhythm.     Pulses: Normal pulses.  Pulmonary:  Effort: Pulmonary effort is normal.     Breath sounds: Normal breath sounds.  Abdominal:     Palpations: Abdomen is soft.     Tenderness: There is no abdominal tenderness.  Skin:    General: Skin is warm and dry.     Findings: No rash.  Neurological:     General: No focal deficit present.     Mental Status: She is alert.     Motor: Weakness present.     Comments: Transient right sided numbness with right facial droop  Psychiatric:        Mood and Affect: Mood normal.        Behavior: Behavior normal.     ED Results / Procedures / Treatments   Labs (all labs ordered are listed, but only abnormal results are displayed) Labs Reviewed  I-STAT CHEM 8, ED - Abnormal; Notable for the following components:      Result  Value   Glucose, Bld 100 (*)    Calcium, Ion 1.14 (*)    Hemoglobin 11.9 (*)    HCT 35.0 (*)    All other components within normal limits    EKG EKG Interpretation  Date/Time:  Thursday April 24 2023 11:07:41 EDT Ventricular Rate:  84 PR Interval:  172 QRS Duration: 91 QT Interval:  357 QTC Calculation: 422 R Axis:   80 Text Interpretation: Sinus rhythm Normal ECG Confirmed by Eber Hong ([id number]) on 04/24/2023 3:48:56 PM  Radiology No results found.  Procedures Procedures: not indicated.   Medications Ordered in ED Medications  iohexol (OMNIPAQUE) 350 MG/ML injection 75 mL (75 mLs Intravenous Contrast Given 04/24/23 1626)    ED Course/ Medical Decision Making/ A&P Clinical Course as of 04/24/23 1647  Thu Apr 24, 2023  1613 Stable  DSVT dx OSH. On Eliquis. Recs per neuro for CT and plan for OPC if negative.  [CC]    Clinical Course User Index [CC] Glyn Ade, MD                             Medical Decision Making Amount and/or Complexity of Data Reviewed Radiology: ordered.   This patient presents to the ED for concern of right sided numbness and right facial droop, this involves an extensive number of treatment options, and is a complaint that carries with it a high risk of complications and morbidity.   Differential diagnosis includes: ICH, complication due to dural sinus venous thrombosis   Co morbidities that complicate the patient evaluation  Dural sinus venous thrombosis IgA glomerulonephritis History of PE Anticoagulant use   Additional history obtained:  Additional history obtained from patient's prior hospital records   Cardiac Monitoring / EKG:  The patient was maintained on a cardiac monitor.  I personally viewed and interpreted the cardiac monitored which showed: sinus rhythm with a heart rate of 84 bpm.   Lab Tests:  I ordered and personally interpreted labs - within normal limits   Imaging Studies ordered:  I ordered  imaging studies including CT head non-contrast and CT venogram head  I independently visualized and interpreted imaging which showed: results pending at shift change. Care taken over by Dr. Glyn Ade. I agree with the radiologist interpretation   Consultations Obtained:  I requested consultation with neurology,  and discussed lab and imaging findings as well as pertinent plan - they recommend: CT  head non-contrast and CT venogram head    Problem List / ED Course /  Critical interventions / Medication management  Right facial droop, right sided extremity numbness, dural sinus venous thrombus   Social Determinants of Health:  Housing Social connections   Test / Admission - Considered:  Patient is stable and safe for discharge home. Follow up with neurology as instructed.         Final Clinical Impression(s) / ED Diagnoses Final diagnoses:  Dural venous sinus thrombosis    Rx / DC Orders ED Discharge Orders     None         Maxwell [place], PA-C 04/24/23 1647    Eber Hong, MD 04/26/23 [phone]

## 2023-04-24 NOTE — Discharge Instructions (Addendum)
Your symptoms may be intermittent due to presence of existing thrombus. Take Eliquis twice a day as instructed to prevent further thrombi formation. Do not miss a dose.  Follow up with neurology. Keep your appointment in July, as scheduled.

## 2023-04-24 NOTE — ED Notes (Signed)
Got patient into a gown on the monitor did EKG shown to Dr Hyacinth Meeker patient is resting with call bell in reach and family at bedside

## 2023-04-24 NOTE — ED Provider Notes (Signed)
Care of patient received from prior provider at 4:14 PM, please see their note for complete H/P and care plan.  Received handoff per ED course.  Clinical Course as of 04/24/23 2319  Thu Apr 24, 2023  1613 Stable  DSVT dx OSH. On Eliquis. Recs per neuro for CT and plan for OPC if negative.  [CC]  1703 Report from 04/20/23  IMPRESSION:  Large volume thrombus within the superior sagittal sinus, bilateral transverse sinuses, and sigmoid sinuses.   [CC]  1718 Reevaluated personally at bedside.  Extensive conversation with the patient's.  She endorsed that all symptoms are resolved.  She states the symptoms that she had earlier were the same symptoms that brought her in for her initial diagnostic evaluation. [CC]    Clinical Course User Index [CC] Glyn Ade, MD    Reassessment: Long conversation at bedside.  Reviewed her presentation to date.  Ultimately discussed the read from today and the read from 2 days ago with neurology on-call Dr. Kirtland Bouchard.  He agrees that there is no interval change and that patient stable to continue on Eliquis in the outpatient setting.  Disposition:  I have considered need for hospitalization, however, considering all of the above, I believe this patient is stable for discharge at this time.  Patient/family educated about specific return precautions for given chief complaint and symptoms.  Patient/family educated about follow-up with PCP/Neurology.     Patient/family expressed understanding of return precautions and need for follow-up. Patient spoken to regarding all imaging and laboratory results and appropriate follow up for these results. All education provided in verbal form with additional information in written form. Time was allowed for answering of patient questions. Patient discharged.    Emergency Department Medication Summary:   Medications  iohexol (OMNIPAQUE) 350 MG/ML injection 75 mL (75 mLs Intravenous Contrast Given 04/24/23 1626)  apixaban  (ELIQUIS) tablet 10 mg (10 mg Oral Given 04/24/23 1742)            Glyn Ade, MD 04/24/23 2320

## 2023-04-26 ENCOUNTER — Emergency Department (HOSPITAL_COMMUNITY): Payer: No Typology Code available for payment source

## 2023-04-26 ENCOUNTER — Observation Stay (HOSPITAL_COMMUNITY)
Admission: EM | Admit: 2023-04-26 | Discharge: 2023-04-28 | Disposition: A | Discharge status: Home / Self Care | Payer: No Typology Code available for payment source | Attending: Internal Medicine | Admitting: Internal Medicine

## 2023-04-26 ENCOUNTER — Other Ambulatory Visit: Payer: Self-pay

## 2023-04-26 DIAGNOSIS — N189 Chronic kidney disease, unspecified: Secondary | ICD-10-CM | POA: Insufficient documentation

## 2023-04-26 DIAGNOSIS — N02B9 Other recurrent and persistent immunoglobulin A nephropathy: Secondary | ICD-10-CM | POA: Diagnosis present

## 2023-04-26 DIAGNOSIS — Z79899 Other long term (current) drug therapy: Secondary | ICD-10-CM | POA: Insufficient documentation

## 2023-04-26 DIAGNOSIS — Z6839 Body mass index (BMI) 39.0-39.9, adult: Secondary | ICD-10-CM | POA: Diagnosis not present

## 2023-04-26 DIAGNOSIS — I129 Hypertensive chronic kidney disease with stage 1 through stage 4 chronic kidney disease, or unspecified chronic kidney disease: Secondary | ICD-10-CM | POA: Diagnosis not present

## 2023-04-26 DIAGNOSIS — E782 Mixed hyperlipidemia: Secondary | ICD-10-CM | POA: Insufficient documentation

## 2023-04-26 DIAGNOSIS — Z86711 Personal history of pulmonary embolism: Secondary | ICD-10-CM | POA: Insufficient documentation

## 2023-04-26 DIAGNOSIS — I10 Essential (primary) hypertension: Secondary | ICD-10-CM | POA: Diagnosis present

## 2023-04-26 DIAGNOSIS — G08 Intracranial and intraspinal phlebitis and thrombophlebitis: Secondary | ICD-10-CM | POA: Diagnosis not present

## 2023-04-26 DIAGNOSIS — Z7901 Long term (current) use of anticoagulants: Secondary | ICD-10-CM | POA: Diagnosis not present

## 2023-04-26 DIAGNOSIS — Z86718 Personal history of other venous thrombosis and embolism: Secondary | ICD-10-CM | POA: Insufficient documentation

## 2023-04-26 DIAGNOSIS — R200 Anesthesia of skin: Secondary | ICD-10-CM | POA: Diagnosis present

## 2023-04-26 LAB — URINALYSIS, ROUTINE W REFLEX MICROSCOPIC
Bacteria, UA: NONE SEEN
Bilirubin Urine: NEGATIVE
Glucose, UA: >=500 mg/dL — AB
Ketones, ur: NEGATIVE mg/dL
Leukocytes,Ua: NEGATIVE
Nitrite: NEGATIVE
Protein, ur: >=300 mg/dL — AB
RBC / HPF: >50 RBC/hpf (ref 0–5)
Specific Gravity, Urine: 1.033 — ABNORMAL HIGH (ref 1.005–1.030)
pH: 5 (ref 5.0–8.0)

## 2023-04-26 LAB — COMPREHENSIVE METABOLIC PANEL
ALT: 14 U/L (ref 0–44)
AST: 13 U/L — ABNORMAL LOW (ref 15–41)
Albumin: 2.7 g/dL — ABNORMAL LOW (ref 3.5–5.0)
Alkaline Phosphatase: 64 U/L (ref 38–126)
Anion gap: 13 (ref 5–15)
BUN: 12 mg/dL (ref 6–20)
CO2: 19 mmol/L — ABNORMAL LOW (ref 22–32)
Calcium: 8.8 mg/dL — ABNORMAL LOW (ref 8.9–10.3)
Chloride: 106 mmol/L (ref 98–111)
Creatinine, Ser: 0.87 mg/dL (ref 0.44–1.00)
GFR, Estimated: >60 mL/min (ref >60)
Glucose, Bld: 108 mg/dL — ABNORMAL HIGH (ref 70–99)
Potassium: 3.5 mmol/L (ref 3.5–5.1)
Sodium: 138 mmol/L (ref 135–145)
Total Bilirubin: <0.1 mg/dL — ABNORMAL LOW (ref 0.3–1.2)
Total Protein: 6.2 g/dL — ABNORMAL LOW (ref 6.5–8.1)

## 2023-04-26 LAB — CBC
HCT: 40.3 % (ref 36.0–46.0)
Hemoglobin: 12.4 g/dL (ref 12.0–15.0)
MCH: 25.6 pg — ABNORMAL LOW (ref 26.0–34.0)
MCHC: 30.8 g/dL (ref 30.0–36.0)
MCV: 83.3 fL (ref 80.0–100.0)
Platelets: 403 10*3/uL — ABNORMAL HIGH (ref 150–400)
RBC: 4.84 MIL/uL (ref 3.87–5.11)
RDW: 14.4 % (ref 11.5–15.5)
WBC: 10.2 10*3/uL (ref 4.0–10.5)
nRBC: 0 % (ref 0.0–0.2)

## 2023-04-26 LAB — RAPID URINE DRUG SCREEN, HOSP PERFORMED
Amphetamines: NOT DETECTED
Barbiturates: NOT DETECTED
Benzodiazepines: NOT DETECTED
Cocaine: NOT DETECTED
Opiates: NOT DETECTED
Tetrahydrocannabinol: NOT DETECTED

## 2023-04-26 LAB — DIFFERENTIAL
Abs Immature Granulocytes: 0.03 10*3/uL (ref 0.00–0.07)
Basophils Absolute: 0.1 10*3/uL (ref 0.0–0.1)
Basophils Relative: 1 %
Eosinophils Absolute: 0 10*3/uL (ref 0.0–0.5)
Eosinophils Relative: 0 %
Immature Granulocytes: 0 %
Lymphocytes Relative: 24 %
Lymphs Abs: 2.5 10*3/uL (ref 0.7–4.0)
Monocytes Absolute: 0.5 10*3/uL (ref 0.1–1.0)
Monocytes Relative: 5 %
Neutro Abs: 7.2 10*3/uL (ref 1.7–7.7)
Neutrophils Relative %: 70 %

## 2023-04-26 LAB — I-STAT BETA HCG BLOOD, ED (MC, WL, AP ONLY): I-stat hCG, quantitative: <5 m[IU]/mL (ref <5)

## 2023-04-26 LAB — APTT: aPTT: 27 seconds (ref 24–36)

## 2023-04-26 LAB — PROTIME-INR
INR: 1.1 (ref 0.8–1.2)
Prothrombin Time: 14.2 seconds (ref 11.4–15.2)

## 2023-04-26 LAB — ETHANOL: Alcohol, Ethyl (B): <10 mg/dL (ref <10)

## 2023-04-26 NOTE — Code Documentation (Signed)
Responded to Code Stroke called on pt in ED triage. Pt arrived to ED at 2122 c/o B arm numbness and headache. Pt has h/o DVT and CVST. Code Stroke called at 2315, [id number]. CBG-108, NIH-2 for R facial droop and B sensory deficit, CT head-negative for acute changes. TNK not given-pt on eliquis. Plan: admit, heparin gtt.

## 2023-04-26 NOTE — ED Triage Notes (Signed)
Pt was recently diagnosed with "blood clot in brain". Pt symptoms were numbness to right side that resolved. Pt had numbness again on Thursday to right side for 10 minutes.. Pt was told if anything changed to come back in. Today at 2100 pt began having numbness to right and left arm, nauseated and has a headache. Pt ambulatory, no weakness

## 2023-04-26 NOTE — ED Notes (Signed)
PA and MD aware of pt symptoms. Gave this RN verbal order for ct head. Order carried out. Pt with no other neuro deficits

## 2023-04-26 NOTE — Consult Note (Signed)
Neurology Consultation Reason for Consult: Progressive neurological symptoms Referring Physician: Rancour, S  CC: Paresthesias  History is obtained from: Patient  HPI: Molly Wise is a 22 y.o. female with a history of DVT who completed a course of anticoagulation and subsequently was diagnosed with CVST.  She initially had improvement in her neurological symptoms including numbness, but subsequently had transient return of her numbness on the right side 2 days ago and came into the emergency department.  Today, she has had return of numbness, initially on the left side, but subsequently her right side is numb and "tingly."  She also has right facial droop and right hand weakness.  Due to the symptoms she returned to care in the emergency department.  She complains of headache in the center of her head on the top.   LKW: 9 PM tnk given?: no, anticoagulated   Past Medical History:  Diagnosis Date   Chronic kidney disease    DVT (deep venous thrombosis) (HCC)      Family History  Problem Relation Age of Onset   Graves' disease Mother    Diabetes Father    Cancer Maternal Grandmother    Cancer - Lung Paternal Grandmother        smoked   Cancer Paternal Grandmother    Diabetes Paternal Grandfather    Hyperlipidemia Paternal Grandfather    Kidney disease Paternal Grandfather      Social History:  reports that she has never smoked. She has never used smokeless tobacco. She reports that she does not drink alcohol and does not use drugs.   Exam: Current vital signs: BP (!) 141/62   Pulse 60   Temp 98.5 F (36.9 C) (Oral)   Resp 16   Ht 5\' 6"  (1.676 m)   Wt 111.1 kg   LMP 04/22/2023   SpO2 97%   BMI 39.54 kg/m  Vital signs in last 24 hours: Temp:  [98.5 F (36.9 C)] 98.5 F (36.9 C) (06/08 2128) Pulse Rate:  [60] 60 (06/08 2128) Resp:  [16] 16 (06/08 2128) BP: (141)/(62) 141/62 (06/08 2128) SpO2:  [97 %] 97 % (06/08 2128) Weight:  [111.1 kg] 111.1 kg (06/08  2145)   Physical Exam  Appears well-developed and well-nourished.   Neuro: Mental Status: Patient is awake, alert, oriented to person, place, month, year, and situation. Patient is able to give a clear and coherent history. No signs of aphasia or neglect Cranial Nerves: II: Visual Fields are full. Pupils are equal, round, and reactive to light.   III,IV, VI: EOMI without ptosis or diploplia.  V: Facial sensation is symmetric to temperature VII: Facial movement with right facial weakness VIII: hearing is intact to voice X: Uvula elevates symmetrically XI: Shoulder shrug is symmetric. XII: tongue is midline without atrophy or fasciculations.  Motor: She has no drift in any extremity, but she does have some distal hand weakness in the right side, 4/5. Sensory: Sensation is diminished bilaterally on the face, associate with paresthesias in both arms Cerebellar: No clear ataxia on finger-nose-finger  I have reviewed labs in epic and the results pertinent to this consultation are: Creatinine 0.87  I have reviewed the images obtained: CT head-negative  Impression: 22 year old female with recent diagnosis of cerebral venous thrombosis with fluctuating mild neurological symptoms that have been present to some degree since onset, but had improved and have now returned.  Given that she has fluctuating neurological symptoms, I do think it would be reasonable to observe her in the  hospital overnight.  She has been started on Eliquis, but I would change her over to heparin for the time being.  There is slightly more data on dabigatran in the setting than Eliquis, though no head-to-head comparisons have been done and therefore we could consider changing her over to dabigatran, which does require a run in phase with heparin or Lovenox.   Recommendations: 1) heparin for anticoagulation 2) repeat MRI/MRV 3) stroke team to follow   Ritta Slot, MD [facility] [phone]  If 7pm- 7am, please page neurology on call as listed in AMION.

## 2023-04-26 NOTE — Progress Notes (Signed)
Was at bedside, pt need to have non-contrast CT asap, was told per RN to return to rm21 after pt done with CT.

## 2023-04-26 NOTE — ED Provider Notes (Signed)
Portal EMERGENCY DEPARTMENT AT [facility] Provider Note   CSN: [id number] Arrival date & time: 04/26/23  2122  An emergency department physician performed an initial assessment on this suspected stroke patient at 2315.  History {Add pertinent medical, surgical, social history, OB history to HPI:1} Chief Complaint  Patient presents with   Numbness    Molly Wise is a 22 y.o. female.  history of dural venous sinus thrombosis, pulmonary embolism, and IgA glomerulonephritis recent diagnosis of dural venous sinus thrombosis on Eliquis for the past 3 days here with left-sided numbness and tingling and weakness.  States symptoms to her left side started about 9 PM.  Involves weakness to her left face, numbness to her face, arm and leg.  She had intermittent numbness and tingling to the right side throughout the day today starting about 2 PM.  This is similar to what she had when she was in the ED 2 days ago.  She was recently hospitalized in [place] and discharged on Lovenox and transition to Eliquis.  Today she had intermittent numbness and tingling to the right side which is similar to what she had previously but now having numbness and tingling to the left side since about 9:00.  Gradual onset headache as well.  No chest pain or shortness of breath.  No abdominal pain, nausea or vomiting.  The history is provided by the patient and a relative.       Home Medications Prior to Admission medications   Medication Sig Start Date End Date Taking? Authorizing Provider  apixaban (ELIQUIS) 2.5 MG TABS tablet Take 1 tablet (2.5 mg total) by mouth 2 (two) times daily. 10/04/22   Rachel Moulds, MD  atorvastatin (LIPITOR) 20 MG tablet Take 20 mg by mouth daily. Patient not taking: Reported on 07/03/2022 12/19/21   [provider]  dapagliflozin propanediol (FARXIGA) 10 MG TABS tablet Take 10 mg by mouth daily. Patient not taking: Reported on 07/03/2022    [provider]  Empagliflozin (JARDIANCE PO) Take 10 mg by mouth daily.    [provider]  FEROSUL 325 (65 Fe) MG tablet Take 325 mg by mouth daily with breakfast. 03/28/21   [provider]  losartan (COZAAR) 25 MG tablet Take 50 mg by mouth 2 (two) times daily. 08/15/21   [provider]  losartan (COZAAR) 50 MG tablet Take 50 mg by mouth 2 (two) times daily.    [provider]  metoprolol tartrate (LOPRESSOR) 50 MG tablet Take 1 tablet by mouth once for procedure. Patient not taking: Reported on 07/03/2022 04/16/22   Sande Rives, MD  mycophenolate (CELLCEPT) 500 MG tablet Take 500 mg by mouth 2 (two) times daily.    [provider]  predniSONE (DELTASONE) 50 MG tablet Take 60 mg by mouth daily with breakfast. Taper dose as ordered    [provider]      Allergies    Patient has no known allergies.    Review of Systems   Review of Systems  Constitutional:  Negative for activity change, appetite change and fever.  HENT:  Negative for congestion.   Respiratory:  Negative for cough, chest tightness and shortness of breath.   Cardiovascular:  Negative for chest pain.  Gastrointestinal:  Negative for abdominal pain, nausea and vomiting.  Genitourinary:  Negative for dysuria and hematuria.  Skin:  Negative for rash.  Neurological:  Positive for dizziness, weakness, light-headedness, numbness and headaches.   all other systems are negative  except as noted in the HPI and PMH.    Physical Exam Updated Vital Signs BP (!) 141/62   Pulse 60   Temp 98.5 F (36.9 C) (Oral)   Resp 16   Ht 5\' 6"  (1.676 m)   Wt 111.1 kg   LMP 04/22/2023   SpO2 97%   BMI 39.54 kg/m  Physical Exam Vitals and nursing note reviewed.  Constitutional:      General: She is not in acute distress.    Appearance: She is well-developed.  HENT:     Head: Normocephalic and atraumatic.     Mouth/Throat:     Pharynx: No oropharyngeal exudate.  Eyes:      Conjunctiva/sclera: Conjunctivae normal.     Pupils: Pupils are equal, round, and reactive to light.  Neck:     Comments: No meningismus. Cardiovascular:     Rate and Rhythm: Normal rate and regular rhythm.     Heart sounds: Normal heart sounds. No murmur heard. Pulmonary:     Effort: Pulmonary effort is normal. No respiratory distress.     Breath sounds: Normal breath sounds.  Abdominal:     Palpations: Abdomen is soft.     Tenderness: There is no abdominal tenderness. There is no guarding or rebound.  Musculoskeletal:        General: No tenderness. Normal range of motion.     Cervical back: Normal range of motion and neck supple.  Skin:    General: Skin is warm.  Neurological:     Mental Status: She is alert and oriented to person, place, and time.     Cranial Nerves: Cranial nerve deficit present.     Sensory: Sensory deficit present.     Motor: Weakness present. No abnormal muscle tone.     Coordination: Coordination normal.     Comments: Asymmetric smile, weakness raising left eyebrow.  Tongue deviation to the left.  But patient is able to her correct this spontaneously.  Slight pronator drift of left arm.  Equal grip strength bilaterally.  Decreased sensation to left arm and left leg.  5/5 strength of bilateral upper and lower extremities.  Psychiatric:        Behavior: Behavior normal.     ED Results / Procedures / Treatments   Labs (all labs ordered are listed, but only abnormal results are displayed) Labs Reviewed  CBC - Abnormal; Notable for the following components:      Result Value   MCH 25.6 (*)    Platelets 403 (*)    All other components within normal limits  COMPREHENSIVE METABOLIC PANEL - Abnormal; Notable for the following components:   CO2 19 (*)    Glucose, Bld 108 (*)    Calcium 8.8 (*)    Total Protein 6.2 (*)    Albumin 2.7 (*)    AST 13 (*)    Total Bilirubin <0.1 (*)    All other components within normal limits  URINALYSIS, ROUTINE W REFLEX  MICROSCOPIC - Abnormal; Notable for the following components:   APPearance HAZY (*)    Specific Gravity, Urine 1.033 (*)    Glucose, UA >=500 (*)    Hgb urine dipstick LARGE (*)    Protein, ur >=300 (*)    All other components within normal limits  ETHANOL  PROTIME-INR  APTT  DIFFERENTIAL  RAPID URINE DRUG SCREEN, HOSP PERFORMED  I-STAT CHEM 8, ED  I-STAT BETA HCG BLOOD, ED ([facility], [facility], [facility] ONLY)  I-STAT CHEM 8, ED  EKG None  Radiology CT HEAD CODE STROKE WO CONTRAST  Result Date: 04/26/2023 CLINICAL DATA:  Code stroke. Initial evaluation for neuro deficit, stroke. EXAM: CT HEAD WITHOUT CONTRAST TECHNIQUE: Contiguous axial images were obtained from the base of the skull through the vertex without intravenous contrast. RADIATION DOSE REDUCTION: This exam was performed according to the departmental dose-optimization program which includes automated exposure control, adjustment of the mA and/or kV according to patient size and/or use of iterative reconstruction technique. COMPARISON:  Prior study from 04/24/2023. FINDINGS: Brain: Cerebral volume within normal limits. No acute intracranial hemorrhage. No acute large vessel territory infarct. No complication related to recently identified dural venous sinus thrombosis identified. No mass lesion or midline shift. No hydrocephalus or extra-axial fluid collection. Vascular: No abnormal hyperdense vessel. Previously identified dural venous sinus thrombosis better evaluated on recent CT venogram. Skull: Scalp soft tissues and calvarium demonstrate no acute finding. Sinuses/Orbits: Globes orbital soft tissues within normal limits. Small right sphenoid sinus retention cyst. Paranasal sinuses are otherwise clear. No mastoid effusion. Other: None. ASPECTS [facility] Stroke Program Early CT Score) - Ganglionic level infarction (caudate, lentiform nuclei, internal capsule, insula, M1-M3 cortex): 7 - Supraganglionic infarction (M4-M6 cortex): 3 Total score (0-10  with 10 being normal): 10 IMPRESSION: 1. No acute intracranial abnormality. No intracranial hemorrhage or other complication related to recently identified dural venous sinus thrombosis. 2. ASPECTS is 10. These results were communicated to Dr. Amada Jupiter at 11:29 pm on 04/26/2023 by text page via the [facility] messaging system. Electronically Signed   By: Rise Mu M.D.   On: 04/26/2023 23:31    Procedures Procedures  {Document cardiac monitor, telemetry assessment procedure when appropriate:1}  Medications Ordered in ED Medications - No data to display  ED Course/ Medical Decision Making/ A&P   {   Click here for ABCD2, HEART and other calculatorsREFRESH Note before signing :1}                          Medical Decision Making Amount and/or Complexity of Data Reviewed Labs: ordered. Decision-making details documented in ED Course. Radiology: ordered and independent interpretation performed. Decision-making details documented in ED Course. ECG/medicine tests: ordered and independent interpretation performed. Decision-making details documented in ED Course.   Patient with recent diagnosis of dural sinus thrombosis here with weakness and numbness to her left side onset at 9 PM.  Code stroke was activated in triage.  Vitals are stable, no distress.  Taken to CT scan with Dr. Luisa Hart of neurology at bedside.  CT is negative for hemorrhage.  Results reviewed interpreted by me.  Dr. Amada Jupiter recommending MRI and MRV. {Document critical care time when appropriate:1} {Document review of labs and clinical decision tools ie heart score, Chads2Vasc2 etc:1}  {Document your independent review of radiology images, and any outside records:1} {Document your discussion with family members, caretakers, and with consultants:1} {Document social determinants of health affecting pt's care:1} {Document your decision making why or why not admission, treatments were needed:1} Final Clinical  Impression(s) / ED Diagnoses Final diagnoses:  None    Rx / DC Orders ED Discharge Orders     None

## 2023-04-27 ENCOUNTER — Observation Stay (HOSPITAL_BASED_OUTPATIENT_CLINIC_OR_DEPARTMENT_OTHER): Payer: No Typology Code available for payment source

## 2023-04-27 ENCOUNTER — Emergency Department (HOSPITAL_COMMUNITY): Payer: No Typology Code available for payment source

## 2023-04-27 ENCOUNTER — Encounter (HOSPITAL_COMMUNITY): Payer: Self-pay | Admitting: Family Medicine

## 2023-04-27 DIAGNOSIS — G08 Intracranial and intraspinal phlebitis and thrombophlebitis: Secondary | ICD-10-CM

## 2023-04-27 DIAGNOSIS — N02B9 Other recurrent and persistent immunoglobulin A nephropathy: Secondary | ICD-10-CM | POA: Diagnosis not present

## 2023-04-27 DIAGNOSIS — I10 Essential (primary) hypertension: Secondary | ICD-10-CM

## 2023-04-27 DIAGNOSIS — E782 Mixed hyperlipidemia: Secondary | ICD-10-CM | POA: Insufficient documentation

## 2023-04-27 LAB — LIPID PANEL
Cholesterol: 237 mg/dL — ABNORMAL HIGH (ref 0–200)
HDL: 45 mg/dL (ref >40)
LDL Cholesterol: 171 mg/dL — ABNORMAL HIGH (ref 0–99)
Total CHOL/HDL Ratio: 5.3 RATIO
Triglycerides: 103 mg/dL (ref <150)
VLDL: 21 mg/dL (ref 0–40)

## 2023-04-27 LAB — BASIC METABOLIC PANEL
Anion gap: 11 (ref 5–15)
BUN: 10 mg/dL (ref 6–20)
CO2: 20 mmol/L — ABNORMAL LOW (ref 22–32)
Calcium: 8.4 mg/dL — ABNORMAL LOW (ref 8.9–10.3)
Chloride: 105 mmol/L (ref 98–111)
Creatinine, Ser: 0.76 mg/dL (ref 0.44–1.00)
GFR, Estimated: >60 mL/min (ref >60)
Glucose, Bld: 115 mg/dL — ABNORMAL HIGH (ref 70–99)
Potassium: 3.4 mmol/L — ABNORMAL LOW (ref 3.5–5.1)
Sodium: 136 mmol/L (ref 135–145)

## 2023-04-27 LAB — ECHOCARDIOGRAM COMPLETE
AR max vel: 2.89 cm2
AV Area VTI: 2.45 cm2
AV Area mean vel: 3.38 cm2
AV Mean grad: 4.7 mmHg
AV Peak grad: 10.6 mmHg
Ao pk vel: 1.63 m/s
Area-P 1/2: 4.31 cm2
Height: 66 in
S' Lateral: 2.8 cm
Weight: 3920 oz

## 2023-04-27 LAB — CBC
HCT: 38 % (ref 36.0–46.0)
Hemoglobin: 11.7 g/dL — ABNORMAL LOW (ref 12.0–15.0)
MCH: 26.2 pg (ref 26.0–34.0)
MCHC: 30.8 g/dL (ref 30.0–36.0)
MCV: 85.2 fL (ref 80.0–100.0)
Platelets: 439 10*3/uL — ABNORMAL HIGH (ref 150–400)
RBC: 4.46 MIL/uL (ref 3.87–5.11)
RDW: 14.5 % (ref 11.5–15.5)
WBC: 15.4 10*3/uL — ABNORMAL HIGH (ref 4.0–10.5)
nRBC: 0 % (ref 0.0–0.2)

## 2023-04-27 LAB — HEPARIN LEVEL (UNFRACTIONATED): Heparin Unfractionated: >1.1 IU/mL — ABNORMAL HIGH (ref 0.30–0.70)

## 2023-04-27 LAB — HEMOGLOBIN A1C
Hgb A1c MFr Bld: 5.6 % (ref 4.8–5.6)
Mean Plasma Glucose: 114.02 mg/dL

## 2023-04-27 LAB — MAGNESIUM: Magnesium: 1.9 mg/dL (ref 1.7–2.4)

## 2023-04-27 LAB — APTT: aPTT: 49 seconds — ABNORMAL HIGH (ref 24–36)

## 2023-04-27 MED ORDER — MYCOPHENOLATE MOFETIL 250 MG PO CAPS
1500.0000 mg | ORAL_CAPSULE | Freq: Two times a day (BID) | ORAL | Status: DC
  Administered 2023-04-27 – 2023-04-28 (×3): 1500 mg via ORAL
  Filled 2023-04-27 (×3): qty 6

## 2023-04-27 MED ORDER — ONDANSETRON HCL 4 MG/2ML IJ SOLN
4.0000 mg | Freq: Once | INTRAMUSCULAR | Status: AC
  Administered 2023-04-27: 4 mg via INTRAVENOUS
  Filled 2023-04-27: qty 2

## 2023-04-27 MED ORDER — HYDROMORPHONE HCL 1 MG/ML IJ SOLN
0.5000 mg | INTRAMUSCULAR | Status: DC | PRN
  Administered 2023-04-27 – 2023-04-28 (×2): 0.5 mg via INTRAVENOUS
  Filled 2023-04-27: qty 1
  Filled 2023-04-27: qty 0.5

## 2023-04-27 MED ORDER — PREDNISONE 5 MG PO TABS
10.0000 mg | ORAL_TABLET | Freq: Every day | ORAL | Status: DC
  Administered 2023-04-27 – 2023-04-28 (×2): 10 mg via ORAL
  Filled 2023-04-27: qty 1
  Filled 2023-04-27: qty 2

## 2023-04-27 MED ORDER — GADOBUTROL 1 MMOL/ML IV SOLN
10.0000 mL | Freq: Once | INTRAVENOUS | Status: AC | PRN
  Administered 2023-04-27: 10 mL via INTRAVENOUS

## 2023-04-27 MED ORDER — SODIUM CHLORIDE 0.9% FLUSH
3.0000 mL | Freq: Two times a day (BID) | INTRAVENOUS | Status: DC
  Administered 2023-04-27 – 2023-04-28 (×3): 3 mL via INTRAVENOUS

## 2023-04-27 MED ORDER — APIXABAN 5 MG PO TABS: 5.0000 mg | ORAL_TABLET | Freq: Two times a day (BID) | ORAL | Status: DC

## 2023-04-27 MED ORDER — PERFLUTREN LIPID MICROSPHERE
1.0000 mL | INTRAVENOUS | Status: AC | PRN
  Administered 2023-04-27: 2 mL via INTRAVENOUS

## 2023-04-27 MED ORDER — ACETAMINOPHEN 325 MG PO TABS
650.0000 mg | ORAL_TABLET | Freq: Four times a day (QID) | ORAL | Status: DC | PRN
  Administered 2023-04-27: 650 mg via ORAL
  Filled 2023-04-27: qty 2

## 2023-04-27 MED ORDER — OXYCODONE HCL 5 MG PO TABS
5.0000 mg | ORAL_TABLET | ORAL | Status: DC | PRN
  Administered 2023-04-27: 5 mg via ORAL
  Filled 2023-04-27: qty 1

## 2023-04-27 MED ORDER — HEPARIN BOLUS VIA INFUSION
1500.0000 [IU] | Freq: Once | INTRAVENOUS | Status: AC
  Administered 2023-04-27: 1500 [IU] via INTRAVENOUS
  Filled 2023-04-27: qty 1500

## 2023-04-27 MED ORDER — ACETAZOLAMIDE 250 MG PO TABS
250.0000 mg | ORAL_TABLET | Freq: Once | ORAL | Status: AC
  Administered 2023-04-27: 250 mg via ORAL
  Filled 2023-04-27: qty 1

## 2023-04-27 MED ORDER — ONDANSETRON HCL 4 MG/2ML IJ SOLN: 4.0000 mg | Freq: Four times a day (QID) | INTRAMUSCULAR | Status: DC | PRN

## 2023-04-27 MED ORDER — SENNOSIDES-DOCUSATE SODIUM 8.6-50 MG PO TABS
1.0000 | ORAL_TABLET | Freq: Every evening | ORAL | Status: DC | PRN
  Administered 2023-04-28: 1 via ORAL
  Filled 2023-04-27: qty 1

## 2023-04-27 MED ORDER — LOSARTAN POTASSIUM 50 MG PO TABS
50.0000 mg | ORAL_TABLET | Freq: Two times a day (BID) | ORAL | Status: DC
  Administered 2023-04-27 – 2023-04-28 (×3): 50 mg via ORAL
  Filled 2023-04-27 (×3): qty 1

## 2023-04-27 MED ORDER — MYCOPHENOLATE MOFETIL 250 MG PO CAPS
500.0000 mg | ORAL_CAPSULE | Freq: Two times a day (BID) | ORAL | Status: DC
  Filled 2023-04-27: qty 2

## 2023-04-27 MED ORDER — MYCOPHENOLATE MOFETIL 250 MG PO CAPS: 1500.0000 mg | ORAL_CAPSULE | Freq: Two times a day (BID) | ORAL | Status: DC

## 2023-04-27 MED ORDER — APIXABAN 5 MG PO TABS
10.0000 mg | ORAL_TABLET | Freq: Two times a day (BID) | ORAL | Status: DC
  Administered 2023-04-27 – 2023-04-28 (×2): 10 mg via ORAL
  Filled 2023-04-27 (×2): qty 2

## 2023-04-27 MED ORDER — ORAL CARE MOUTH RINSE: 15.0000 mL | OROMUCOSAL | Status: DC | PRN

## 2023-04-27 MED ORDER — ACETAZOLAMIDE 250 MG PO TABS
250.0000 mg | ORAL_TABLET | Freq: Two times a day (BID) | ORAL | Status: DC
  Administered 2023-04-27 – 2023-04-28 (×2): 250 mg via ORAL
  Filled 2023-04-27 (×2): qty 1

## 2023-04-27 MED ORDER — HEPARIN (PORCINE) 25000 UT/250ML-% IV SOLN
1700.0000 [IU]/h | INTRAVENOUS | Status: AC
  Administered 2023-04-27: 1700 [IU]/h via INTRAVENOUS
  Administered 2023-04-27: 1500 [IU]/h via INTRAVENOUS
  Filled 2023-04-27 (×2): qty 250

## 2023-04-27 NOTE — ED Notes (Signed)
ED TO INPATIENT HANDOFF REPORT  ED Nurse Name and Phone #: Rulon Eisenmenger Name/Age/Gender Molly Wise 22 y.o. female Room/Bed: 040C/040C  Code Status   Code Status: Full Code  Home/SNF/Other Home Patient oriented to: self, place, time, and situation Is this baseline? Yes   Triage Complete: Triage complete  Chief Complaint Cerebral venous thrombosis [G08]  Triage Note Pt was recently diagnosed with "blood clot in brain". Pt symptoms were numbness to right side that resolved. Pt had numbness again on Thursday to right side for 10 minutes.. Pt was told if anything changed to come back in. Today at 2100 pt began having numbness to right and left arm, nauseated and has a headache. 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]  Admitting Physician: Briscoe Deutscher [[id number]]  Attending Physician: Briscoe Deutscher [[id number]]          B Medical/Surgery History Past Medical History:  Diagnosis Date   Chronic kidney disease    DVT (deep venous thrombosis) (HCC)    Past Surgical History:  Procedure Laterality Date   RENAL BIOPSY Left 2022     A IV Location/Drains/Wounds Patient Lines/Drains/Airways Status     Active Line/Drains/Airways     Name Placement date Placement time Site Days   Peripheral IV 04/27/23 20 G 1.88" Anterior;Right Forearm 04/27/23  0009  Forearm  less than 1   Peripheral IV 04/27/23 22 G 1.75" Left Antecubital 04/27/23  0027  Antecubital  less than 1            Intake/Output Last  24 hours No intake or output data in the 24 hours ending 04/27/23 1248  Labs/Imaging Results for orders placed or performed during the hospital encounter of 04/26/23 (from the past 48 hour(s))  I-Stat beta hCG blood, ED     Status: None   Collection Time: 04/26/23  9:46 PM  Result Value Ref Range   I-stat hCG, quantitative <5.0 <5 mIU/mL   Comment 3            Comment:   GEST. AGE      CONC.  (mIU/mL)   <=1 WEEK        5 - 50     2 WEEKS       50 - 500     3 WEEKS       100 - 10,000     4 WEEKS     1,000 - 30,000        FEMALE AND NON-PREGNANT FEMALE:     LESS THAN 5 mIU/mL   Ethanol     Status: None   Collection Time: 04/26/23  9:58 PM  Result Value Ref Range   Alcohol, Ethyl (B) <10 <10 mg/dL    Comment: (NOTE) Lowest detectable limit for serum alcohol is 10 mg/dL.  For medical purposes only. Performed at [facility], [address], [place], [place] [address]   Protime-INR     Status: None   Collection Time: 04/26/23  9:58 PM  Result Value Ref Range   Prothrombin Time 14.2 11.4 - 15.2 seconds   INR 1.1 0.8 - 1.2    Comment: (NOTE) INR goal varies based on device and disease states. Performed at [facility], [address], [place], [place] [address]   APTT     Status: None   Collection Time: 04/26/23  9:58 PM  Result Value Ref Range   aPTT 27 24 - 36 seconds    Comment: Performed at [facility], [address] [place], [place] [address]  CBC     Status: Abnormal   Collection Time: 04/26/23  9:58 PM  Result Value Ref Range   WBC 10.2 4.0 - 10.5 K/uL   RBC 4.84 3.87 - 5.11 MIL/uL   Hemoglobin 12.4 12.0 - 15.0 g/dL   HCT 09.8 11.9 - 14.7 %   MCV 83.3 80.0 - 100.0 fL   MCH 25.6 (L) 26.0 - 34.0 pg   MCHC 30.8 30.0 - 36.0 g/dL   RDW 82.9 56.2 - 13.0 %   Platelets 403 (H) 150 - 400 K/uL   nRBC 0.0 0.0 - 0.2 %    Comment: Performed at [facility], [address] [place], [place] [address]  Differential     Status: None   Collection  Time: 04/26/23  9:58 PM  Result Value Ref Range   Neutrophils Relative % 70 %   Neutro Abs 7.2 1.7 - 7.7 K/uL   Lymphocytes Relative 24 %   Lymphs Abs 2.5 0.7 - 4.0 K/uL   Monocytes Relative 5 %   Monocytes Absolute 0.5 0.1 - 1.0 K/uL   Eosinophils Relative 0 %   Eosinophils Absolute 0.0 0.0 - 0.5 K/uL   Basophils Relative 1 %   Basophils Absolute 0.1 0.0 - 0.1 K/uL   Immature Granulocytes 0 %   Abs Immature Granulocytes 0.03 0.00 - 0.07 K/uL    Comment: Performed at [facility], [address], [place], [place] [address]  Comprehensive metabolic panel     Status: Abnormal   Collection Time: 04/26/23  9:58 PM  Result Value Ref Range   Sodium 138 135 - 145 mmol/L   Potassium 3.5 3.5 - 5.1 mmol/L   Chloride 106 98 - 111 mmol/L   CO2 19 (L) 22 - 32 mmol/L   Glucose, Bld 108 (H) 70 - 99 mg/dL    Comment: Glucose reference range applies only to samples taken after fasting for at least 8 hours.   BUN 12 6 - 20 mg/dL   Creatinine, Ser 9.52 0.44 - 1.00 mg/dL   Calcium 8.8 (L) 8.9 - 10.3 mg/dL   Total Protein 6.2 (L) 6.5 - 8.1 g/dL   Albumin 2.7 (L) 3.5 - 5.0 g/dL   AST 13 (L) 15 - 41 U/L   ALT 14 0 - 44 U/L   Alkaline Phosphatase 64 38 - 126 U/L   Total Bilirubin <0.1 (L) 0.3 - 1.2 mg/dL   GFR, Estimated >84 >13 mL/min    Comment: (NOTE) Calculated using the CKD-EPI Creatinine Equation (2021)    Anion gap 13 5 - 15    Comment: Performed at [facility], [address], [place], [place] [address]  Urine rapid drug screen (hosp performed)     Status: None   Collection Time: 04/26/23 10:02 PM  Result Value Ref Range   Opiates NONE DETECTED NONE DETECTED   Cocaine NONE DETECTED NONE DETECTED   Benzodiazepines NONE DETECTED  NONE DETECTED   Amphetamines NONE DETECTED NONE DETECTED   Tetrahydrocannabinol NONE DETECTED NONE DETECTED   Barbiturates NONE DETECTED NONE DETECTED    Comment: (NOTE) DRUG SCREEN FOR MEDICAL PURPOSES ONLY.  IF CONFIRMATION IS NEEDED FOR ANY  PURPOSE, NOTIFY LAB WITHIN 5 DAYS.  LOWEST DETECTABLE LIMITS FOR URINE DRUG SCREEN Drug Class                     Cutoff (ng/mL) Amphetamine and metabolites    1000 Barbiturate and metabolites    200 Benzodiazepine                 200 Opiates and metabolites        300 Cocaine and metabolites        300 THC                            50 Performed at [facility], [address], [place], [place] [address]   Urinalysis, Routine w reflex microscopic -Urine, Clean Catch     Status: Abnormal   Collection Time: 04/26/23 10:02 PM  Result Value Ref Range   Color, Urine YELLOW YELLOW   APPearance HAZY (A) CLEAR   Specific Gravity, Urine 1.033 (H) 1.005 - 1.030   pH 5.0 5.0 - 8.0   Glucose, UA >=500 (A) NEGATIVE mg/dL   Hgb urine dipstick LARGE (A) NEGATIVE   Bilirubin Urine NEGATIVE NEGATIVE   Ketones, ur NEGATIVE NEGATIVE mg/dL   Protein, ur >=130 (A) NEGATIVE mg/dL   Nitrite NEGATIVE NEGATIVE   Leukocytes,Ua NEGATIVE NEGATIVE   RBC / HPF >50 0 - 5 RBC/hpf   WBC, UA 0-5 0 - 5 WBC/hpf   Bacteria, UA NONE SEEN NONE SEEN   Squamous Epithelial / HPF 0-5 0 - 5 /HPF   Hyaline Casts, UA PRESENT     Comment: Performed at [facility], [address] [place], [place] [address]  Basic metabolic panel     Status: Abnormal   Collection Time: 04/27/23  5:32 AM  Result Value Ref Range   Sodium 136 135 - 145 mmol/L   Potassium 3.4 (L) 3.5 - 5.1 mmol/L   Chloride 105 98 - 111 mmol/L   CO2 20 (L) 22 - 32 mmol/L   Glucose, Bld 115 (H) 70 - 99 mg/dL    Comment: Glucose reference range applies only to samples taken after fasting for at least 8 hours.   BUN 10 6 - 20 mg/dL   Creatinine, Ser 4.69 0.44 - 1.00 mg/dL   Calcium 8.4 (L) 8.9 - 10.3 mg/dL   GFR, Estimated >62 >95 mL/min    Comment: (NOTE) Calculated using the CKD-EPI Creatinine Equation (2021)    Anion gap 11 5 - 15    Comment: Performed at [facility], [address], [place], [place] [address]  Magnesium      Status: None   Collection Time: 04/27/23  5:32 AM  Result Value Ref Range   Magnesium 1.9 1.7 - 2.4 mg/dL    Comment: Performed at [facility], [address] [place], [place] [address]  CBC     Status: Abnormal   Collection Time: 04/27/23  5:32 AM  Result Value Ref Range   WBC 15.4 (H) 4.0 - 10.5 K/uL   RBC 4.46 3.87 - 5.11 MIL/uL   Hemoglobin 11.7 (L) 12.0 - 15.0 g/dL   HCT 02.7 25.3 - 66.4 %  MCV 85.2 80.0 - 100.0 fL   MCH 26.2 26.0 - 34.0 pg   MCHC 30.8 30.0 - 36.0 g/dL   RDW 16.1 09.6 - 04.5 %   Platelets 439 (H) 150 - 400 K/uL   nRBC 0.0 0.0 - 0.2 %    Comment: Performed at [facility], [address], [place], [place] [address]  Hemoglobin A1c     Status: None   Collection Time: 04/27/23  5:32 AM  Result Value Ref Range   Hgb A1c MFr Bld 5.6 4.8 - 5.6 %    Comment: (NOTE) Pre diabetes:          5.7%-6.4%  Diabetes:              >6.4%  Glycemic control for   <7.0% adults with diabetes    Mean Plasma Glucose 114.02 mg/dL    Comment: Performed at [facility], [address] [place], [place] [address]  Lipid panel     Status: Abnormal   Collection Time: 04/27/23  5:32 AM  Result Value Ref Range   Cholesterol 237 (H) 0 - 200 mg/dL   Triglycerides 829 <562 mg/dL   HDL 45 >13 mg/dL   Total CHOL/HDL Ratio 5.3 RATIO   VLDL 21 0 - 40 mg/dL   LDL Cholesterol 086 (H) 0 - 99 mg/dL    Comment:        Total Cholesterol/HDL:CHD Risk Coronary Heart Disease Risk Table                     Men   Women  1/2 Average Risk   3.4   3.3  Average Risk       5.0   4.4  2 X Average Risk   9.6   7.1  3 X Average Risk  23.4   11.0        Use the calculated Patient Ratio above and the CHD Risk Table to determine the patient's CHD Risk.        ATP III CLASSIFICATION (LDL):  <100     mg/dL   Optimal  578-469  mg/dL   Near or Above                    Optimal  130-159  mg/dL   Borderline  629-528  mg/dL   High  >413     mg/dL   Very High Performed at [facility], [address], [place], [place] [address]   Heparin level (unfractionated)     Status: Abnormal   Collection Time: 04/27/23  8:57 AM  Result Value Ref Range   Heparin Unfractionated >1.10 (H) 0.30 - 0.70 IU/mL    Comment: (NOTE) The clinical reportable range upper limit is being lowered to >1.10 to align with the FDA approved guidance for the current laboratory assay.  If heparin results are below expected values, and patient dosage has  been confirmed, suggest follow up testing of antithrombin III levels. Performed at [facility], [address], [place], [place] [address]   APTT     Status: Abnormal   Collection Time: 04/27/23  8:57 AM  Result Value Ref Range   aPTT 49 (H) 24 - 36 seconds    Comment:        IF BASELINE aPTT IS ELEVATED, SUGGEST PATIENT RISK ASSESSMENT BE USED TO DETERMINE APPROPRIATE ANTICOAGULANT THERAPY. Performed at [facility], [address], [place],  [place] [address]    MR BRAIN WO CONTRAST  Result Date: 04/27/2023 CLINICAL DATA:  Initial evaluation for dural venous sinus thrombosis, neuro deficit EXAM: MRI HEAD WITHOUT CONTRAST MRV HEAD WITHOUT AND WITH CONTRAST TECHNIQUE: Multiplanar, multiecho pulse sequences of the brain and surrounding structures were obtained without and with intravenous contrast. Angiographic images of the intracranial venous structures were obtained using MRV technique without intravenous contrast. CONTRAST:  10mL GADAVIST GADOBUTROL 1 MMOL/ML IV SOLN COMPARISON:  Prior study from earlier the same day. FINDINGS: MRI BRAIN FINDINGS Brain: Cerebral volume within normal limits for age. No focal parenchymal signal abnormality. No abnormal foci of restricted diffusion to suggest acute or subacute ischemia. Gray-white matter differentiation well maintained. No encephalomalacia to suggest chronic cortical infarction or other insult. No foci of susceptibility artifact indicative of acute or chronic intracranial blood  products. Diffuse prominence of the intracranial vasculature on SWI sequence, consistent with venous congestion related to dural venous sinus thrombosis. No mass lesion, midline shift or mass effect. Ventricles normal in size and morphology without hydrocephalus. No extra-axial fluid collection. Pituitary gland and suprasellar region within normal limits. Vascular: Major intracranial arterial vascular flow voids are maintained. Findings consistent with persistent dural venous sinus thrombosis, described on corresponding MRV. Skull and upper cervical spine: Craniocervical junction within normal limits. Diffuse loss of normal bone marrow signal, nonspecific but can be seen with anemia, smoking, obesity, and infiltrative/myelofibrotic marrow processes. No focal marrow replacing lesion. No scalp soft tissue abnormality. Sinuses/Orbits: Globes orbital soft tissues demonstrate no acute finding. Paranasal sinuses are largely clear. Small left mastoid effusion noted. Other: None. MRV HEAD FINDINGS Persistent fairly extensive filling defects seen throughout the majority of the superior sagittal sinus to the torcula. Filling defect extends to involve the left transverse and sigmoid sinuses as well as the left jugular bulb and proximal left internal jugular vein. Right transverse sinus patent. Small volume nonocclusive thrombus within the right sigmoid sinus (series 3, image 51). Right jugular bulb and proximal right internal jugular vein are patent. Straight sinus, vein of Galen, and internal cerebral veins are patent. No appreciable cortical vein thrombosis. No appreciable abnormality about the cavernous sinus. Overall, appearance is similar to prior. IMPRESSION: 1. 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 Small volume nonocclusive thrombus within the  right sigmoid sinus. Overall, appearance is similar to prior. To involve the left transverse and sigmoid sinuses as well as the left jugular bulb and proximal left internal jugular vein. 2. Otherwise normal brain MRI. No other acute intracranial abnormality. Electronically Signed   By: Rise Mu M.D.   On: 04/27/2023 02:21   MR MRV HEAD W WO CONTRAST  Result Date: 04/27/2023 CLINICAL DATA:  Initial evaluation for dural venous sinus thrombosis, neuro deficit EXAM: MRI HEAD WITHOUT CONTRAST MRV HEAD WITHOUT AND WITH CONTRAST TECHNIQUE: Multiplanar, multiecho pulse sequences of the brain and surrounding structures were obtained without and with intravenous contrast. Angiographic images of the intracranial venous structures were obtained using MRV technique without intravenous contrast. CONTRAST:  10mL GADAVIST GADOBUTROL 1 MMOL/ML IV SOLN COMPARISON:  Prior study from earlier the same day. FINDINGS: MRI BRAIN FINDINGS Brain: Cerebral volume within normal limits for age. No focal parenchymal signal abnormality. No abnormal foci of restricted diffusion to suggest acute or subacute ischemia. Gray-white matter differentiation well maintained. No encephalomalacia to suggest chronic cortical infarction or other insult. No  foci of susceptibility artifact indicative of acute or chronic intracranial blood products. Diffuse prominence of the intracranial vasculature on SWI sequence, consistent with venous congestion related to dural venous sinus thrombosis. No mass lesion, midline shift or mass effect. Ventricles normal in size and morphology without hydrocephalus. No extra-axial fluid collection. Pituitary gland and suprasellar region within normal limits. Vascular: Major intracranial arterial vascular flow voids are maintained. Findings consistent with persistent dural venous sinus thrombosis, described on corresponding MRV. Skull and upper cervical spine: Craniocervical junction within normal limits. Diffuse  loss of normal bone marrow signal, nonspecific but can be seen with anemia, smoking, obesity, and infiltrative/myelofibrotic marrow processes. No focal marrow replacing lesion. No scalp soft tissue abnormality. Sinuses/Orbits: Globes orbital soft tissues demonstrate no acute finding. Paranasal sinuses are largely clear. Small left mastoid effusion noted. Other: None. MRV HEAD FINDINGS Persistent fairly extensive filling defects seen throughout the majority of the superior sagittal sinus to the torcula. Filling defect extends to involve the left transverse and sigmoid sinuses as well as the left jugular bulb and proximal left internal jugular vein. Right transverse sinus patent. Small volume nonocclusive thrombus within the right sigmoid sinus (series 3, image 51). Right jugular bulb and proximal right internal jugular vein are patent. Straight sinus, vein of Galen, and internal cerebral veins are patent. No appreciable cortical vein thrombosis. No appreciable abnormality about the cavernous sinus. Overall, appearance is similar to prior. IMPRESSION: 1. 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 Small volume nonocclusive thrombus within the right sigmoid sinus. Overall, appearance is similar to prior. To involve the left transverse and sigmoid sinuses as well as the left jugular bulb and proximal left internal jugular vein. 2. Otherwise normal brain MRI. No other acute intracranial abnormality. Electronically Signed   By: Rise Mu M.D.   On: 04/27/2023 02:21   CT HEAD CODE STROKE WO CONTRAST  Result Date: 04/26/2023 CLINICAL DATA:  Code stroke. Initial evaluation for neuro deficit, stroke. EXAM: CT HEAD WITHOUT CONTRAST TECHNIQUE: Contiguous axial images were obtained from the base of the skull through the vertex without intravenous  contrast. RADIATION DOSE REDUCTION: This exam was performed according to the departmental dose-optimization program which includes automated exposure control, adjustment of the mA and/or kV according to patient size and/or use of iterative reconstruction technique. COMPARISON:  Prior study from 04/24/2023. FINDINGS: Brain: Cerebral volume within normal limits. No acute intracranial hemorrhage. No acute large vessel territory infarct. No complication related to recently identified dural venous sinus thrombosis identified. No mass lesion or midline shift. No hydrocephalus or extra-axial fluid collection. Vascular: No abnormal hyperdense vessel. Previously identified dural venous sinus thrombosis better evaluated on recent CT venogram. Skull: Scalp soft tissues and calvarium demonstrate no acute finding. Sinuses/Orbits: Globes orbital soft tissues within normal limits. Small right sphenoid sinus retention cyst. Paranasal sinuses are otherwise clear. No mastoid effusion. Other: None. ASPECTS [facility] Stroke Program Early CT Score) - Ganglionic level infarction (caudate, lentiform nuclei, internal capsule, insula, M1-M3 cortex): 7 - Supraganglionic infarction (M4-M6 cortex): 3 Total score (0-10 with 10 being normal): 10 IMPRESSION: 1. No acute intracranial abnormality. No intracranial hemorrhage or other complication related to recently identified dural venous sinus thrombosis. 2. ASPECTS is 10. These results were communicated to Dr. Amada Jupiter at 11:29 pm on 04/26/2023 by text page via the [facility] messaging system. Electronically Signed   By: Rise Mu  M.D.   On: 04/26/2023 23:31    Pending Labs Unresulted Labs (From admission, onward)     Start     Ordered   04/28/23 1800  APTT  Once-Timed,   R        04/27/23 1106   04/28/23 0500  Heparin level (unfractionated)  Daily,   R     See Hyperspace for full Linked Orders Report.   04/27/23 0022   04/28/23 0500  APTT  Daily,   R     See Hyperspace for full  Linked Orders Report.   04/27/23 0022   04/28/23 0500  CBC  Daily,   R     See Hyperspace for full Linked Orders Report.   04/27/23 0022   04/27/23 1200  HIV Antibody (routine testing w rflx)  Once,   R        04/27/23 1200   04/27/23 0532  HIV Antibody (routine testing w rflx)  Once,   R        04/27/23 0532   04/27/23 0500  Basic metabolic panel  Daily,   R      04/27/23 0237   04/27/23 0500  CBC  Daily,   R      04/27/23 0237            Vitals/Pain Today's Vitals   04/27/23 1100 04/27/23 1115 04/27/23 1116 04/27/23 1206  BP:  103/70    Pulse: 73 (!) 59 73   Resp: 17 18    Temp:    98.2 F (36.8 C)  TempSrc:    Oral  SpO2: 100% 100%    Weight:      Height:      PainSc:        Isolation Precautions No active isolations  Medications Medications  heparin ADULT infusion 100 units/mL (25000 units/244mL) (1,700 Units/hr Intravenous Rate/Dose Change 04/27/23 1118)  acetaminophen (TYLENOL) tablet 650 mg (650 mg Oral Given 04/27/23 0201)  oxyCODONE (Oxy IR/ROXICODONE) immediate release tablet 5 mg (has no administration in time range)  HYDROmorphone (DILAUDID) injection 0.5 mg (0.5 mg Intravenous Given 04/27/23 0232)  ondansetron (ZOFRAN) injection 4 mg (has no administration in time range)  losartan (COZAAR) tablet 50 mg (50 mg Oral Given 04/27/23 0833)  predniSONE (DELTASONE) tablet 10 mg (10 mg Oral Given 04/27/23 0833)  sodium chloride flush (NS) 0.9 % injection 3 mL (3 mLs Intravenous Given 04/27/23 0940)  senna-docusate (Senokot-S) tablet 1 tablet (has no administration in time range)  mycophenolate (CELLCEPT) capsule 1,500 mg (1,500 mg Oral Given 04/27/23 0448)  ondansetron (ZOFRAN) injection 4 mg (4 mg Intravenous Given 04/27/23 0016)  gadobutrol (GADAVIST) 1 MMOL/ML injection 10 mL (10 mLs Intravenous Contrast Given 04/27/23 0111)  heparin bolus via infusion 1,500 Units (1,500 Units Intravenous Bolus from Bag 04/27/23 1119)    Mobility walks     Focused Assessments      R Recommendations: See Admitting Provider Note  Report given to:   Additional Notes: Pt is AOX4, walky/talky, on heparin at 17, family at bedside, has not c/o of a headache and dizziness with me, but it was her main symptoms for coming in

## 2023-04-27 NOTE — ED Notes (Signed)
Patient is awake and alert this morning, eating breakfast, no s/s of distress, ambulated to the restroom, family at bedside.

## 2023-04-27 NOTE — Progress Notes (Addendum)
STROKE TEAM PROGRESS NOTE   INTERVAL HISTORY [name] is a 22 y.o. 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 She was initially diagnosed with the CVST at [facility] in [place] on 04/20/2023. She was initially on heparin IV and then discharged on eliquis. She has not missed any doses of eliquis. She is currently on heparin IV and we will likely start diamox due to concern for increased ICP.   Vitals:   04/27/23 0515 04/27/23 0600 04/27/23 0645 04/27/23 0720  BP: (!) 102/57 102/69 (!) 103/57   Pulse: (!) 57 (!) 59 66   Resp: 16     Temp:    98.8 F (37.1 C)  TempSrc:    Oral  SpO2: 100% 100% 100%   Weight:      Height:       CBC:  Recent Labs  Lab 04/26/23 2158 04/27/23 0532  WBC 10.2 15.4*  NEUTROABS 7.2  --   HGB 12.4 11.7*  HCT 40.3 38.0  MCV 83.3 85.2  PLT 403* 439*   Basic Metabolic Panel:  Recent Labs  Lab 04/26/23 2158 04/27/23 0532  NA 138 136  K 3.5 3.4*  CL 106 105  CO2 19* 20*  GLUCOSE 108* 115*  BUN 12 10  CREATININE 0.87 0.76  CALCIUM 8.8* 8.4*  MG  --  1.9   Urine Drug Screen:  Recent Labs  Lab 04/26/23 2202  LABOPIA NONE DETECTED  COCAINSCRNUR NONE DETECTED  LABBENZ NONE DETECTED  AMPHETMU NONE DETECTED  THCU NONE DETECTED  LABBARB NONE DETECTED    Alcohol Level  Recent Labs  Lab 04/26/23 2158  ETH <10    IMAGING past 24 hours MR BRAIN WO CONTRAST  Result Date: 04/27/2023 CLINICAL DATA:  Initial evaluation for dural venous sinus thrombosis, neuro deficit EXAM: MRI HEAD WITHOUT CONTRAST MRV HEAD WITHOUT AND WITH CONTRAST TECHNIQUE: Multiplanar, multiecho pulse sequences of the brain and surrounding structures were obtained without and with intravenous contrast. Angiographic images of the intracranial venous structures were obtained using MRV technique without intravenous contrast. CONTRAST:  10mL GADAVIST GADOBUTROL 1 MMOL/ML IV SOLN COMPARISON:  Prior  study from earlier the same day. FINDINGS: MRI BRAIN FINDINGS Brain: Cerebral volume within normal limits for age. No focal parenchymal signal abnormality. No abnormal foci of restricted diffusion to suggest acute or subacute ischemia. Gray-white matter differentiation well maintained. No encephalomalacia to suggest chronic cortical infarction or other insult. No foci of susceptibility artifact indicative of acute or chronic intracranial blood products. Diffuse prominence of the intracranial vasculature on SWI sequence, consistent with venous congestion related to dural venous sinus thrombosis. No mass lesion, midline shift or mass effect. Ventricles normal in size and morphology without hydrocephalus. No extra-axial fluid collection. Pituitary gland and suprasellar region within normal limits. Vascular: Major intracranial arterial vascular flow voids are maintained. Findings consistent with persistent dural venous sinus thrombosis, described on corresponding MRV. Skull and upper cervical spine: Craniocervical junction within normal limits. Diffuse loss of normal bone marrow signal, nonspecific but can be seen with anemia, smoking, obesity, and infiltrative/myelofibrotic marrow processes. No focal marrow replacing lesion. No scalp soft tissue abnormality. Sinuses/Orbits: Globes orbital soft tissues demonstrate no acute finding. Paranasal sinuses are largely clear. Small left mastoid effusion noted. Other: None. MRV HEAD FINDINGS Persistent fairly extensive filling defects seen throughout the majority of the superior sagittal sinus to the torcula. Filling defect extends to involve the left transverse and sigmoid  sinuses as well as the left jugular bulb and proximal left internal jugular vein. Right transverse sinus patent. Small volume nonocclusive thrombus within the right sigmoid sinus (series 3, image 51). Right jugular bulb and proximal right internal jugular vein are patent. Straight sinus, vein of Galen, and  internal cerebral veins are patent. No appreciable cortical vein thrombosis. No appreciable abnormality about the cavernous sinus. Overall, appearance is similar to prior. IMPRESSION: 1. 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 Small volume nonocclusive thrombus within the right sigmoid sinus. Overall, appearance is similar to prior. To involve the left transverse and sigmoid sinuses as well as the left jugular bulb and proximal left internal jugular vein. 2. Otherwise normal brain MRI. No other acute intracranial abnormality. Electronically Signed   By: Rise Mu M.D.   On: 04/27/2023 02:21   MR MRV HEAD W WO CONTRAST  Result Date: 04/27/2023 CLINICAL DATA:  Initial evaluation for dural venous sinus thrombosis, neuro deficit EXAM: MRI HEAD WITHOUT CONTRAST MRV HEAD WITHOUT AND WITH CONTRAST TECHNIQUE: Multiplanar, multiecho pulse sequences of the brain and surrounding structures were obtained without and with intravenous contrast. Angiographic images of the intracranial venous structures were obtained using MRV technique without intravenous contrast. CONTRAST:  10mL GADAVIST GADOBUTROL 1 MMOL/ML IV SOLN COMPARISON:  Prior study from earlier the same day. FINDINGS: MRI BRAIN FINDINGS Brain: Cerebral volume within normal limits for age. No focal parenchymal signal abnormality. No abnormal foci of restricted diffusion to suggest acute or subacute ischemia. Gray-white matter differentiation well maintained. No encephalomalacia to suggest chronic cortical infarction or other insult. No foci of susceptibility artifact indicative of acute or chronic intracranial blood products. Diffuse prominence of the intracranial vasculature on SWI sequence, consistent with venous congestion related to dural venous sinus thrombosis. No mass lesion,  midline shift or mass effect. Ventricles normal in size and morphology without hydrocephalus. No extra-axial fluid collection. Pituitary gland and suprasellar region within normal limits. Vascular: Major intracranial arterial vascular flow voids are maintained. Findings consistent with persistent dural venous sinus thrombosis, described on corresponding MRV. Skull and upper cervical spine: Craniocervical junction within normal limits. Diffuse loss of normal bone marrow signal, nonspecific but can be seen with anemia, smoking, obesity, and infiltrative/myelofibrotic marrow processes. No focal marrow replacing lesion. No scalp soft tissue abnormality. Sinuses/Orbits: Globes orbital soft tissues demonstrate no acute finding. Paranasal sinuses are largely clear. Small left mastoid effusion noted. Other: None. MRV HEAD FINDINGS Persistent fairly extensive filling defects seen throughout the majority of the superior sagittal sinus to the torcula. Filling defect extends to involve the left transverse and sigmoid sinuses as well as the left jugular bulb and proximal left internal jugular vein. Right transverse sinus patent. Small volume nonocclusive thrombus within the right sigmoid sinus (series 3, image 51). Right jugular bulb and proximal right internal jugular vein are patent. Straight sinus, vein of Galen, and internal cerebral veins are patent. No appreciable cortical vein thrombosis. No appreciable abnormality about the cavernous sinus. Overall, appearance is similar to prior. IMPRESSION: 1. 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 Small volume nonocclusive thrombus within the right sigmoid sinus. Overall, appearance is similar to prior. To involve the left transverse and sigmoid sinuses as well as the left  jugular bulb and proximal left  internal jugular vein. 2. Otherwise normal brain MRI. No other acute intracranial abnormality. Electronically Signed   By: Rise Mu M.D.   On: 04/27/2023 02:21   CT HEAD CODE STROKE WO CONTRAST  Result Date: 04/26/2023 CLINICAL DATA:  Code stroke. Initial evaluation for neuro deficit, stroke. EXAM: CT HEAD WITHOUT CONTRAST TECHNIQUE: Contiguous axial images were obtained from the base of the skull through the vertex without intravenous contrast. RADIATION DOSE REDUCTION: This exam was performed according to the departmental dose-optimization program which includes automated exposure control, adjustment of the mA and/or kV according to patient size and/or use of iterative reconstruction technique. COMPARISON:  Prior study from 04/24/2023. FINDINGS: Brain: Cerebral volume within normal limits. No acute intracranial hemorrhage. No acute large vessel territory infarct. No complication related to recently identified dural venous sinus thrombosis identified. No mass lesion or midline shift. No hydrocephalus or extra-axial fluid collection. Vascular: No abnormal hyperdense vessel. Previously identified dural venous sinus thrombosis better evaluated on recent CT venogram. Skull: Scalp soft tissues and calvarium demonstrate no acute finding. Sinuses/Orbits: Globes orbital soft tissues within normal limits. Small right sphenoid sinus retention cyst. Paranasal sinuses are otherwise clear. No mastoid effusion. Other: None. ASPECTS [facility] Stroke Program Early CT Score) - Ganglionic level infarction (caudate, lentiform nuclei, internal capsule, insula, M1-M3 cortex): 7 - Supraganglionic infarction (M4-M6 cortex): 3 Total score (0-10 with 10 being normal): 10 IMPRESSION: 1. No acute intracranial abnormality. No intracranial hemorrhage or other complication related to recently identified dural venous sinus thrombosis. 2. ASPECTS is 10. These results were communicated to Dr. Amada Jupiter at 11:29 pm on 04/26/2023 by  text page via the [facility] messaging system. Electronically Signed   By: Rise Mu M.D.   On: 04/26/2023 23:31    PHYSICAL EXAM Appears well-developed and well-nourished.    Neuro: Mental Status: Patient is awake, alert, oriented to person, place, month, year, and situation. Patient is able to give a clear and coherent history. No signs of aphasia or neglect Cranial Nerves: II: Visual Fields are full. Pupils are equal, round, and reactive to light. Blurry disc margin bilaterally III,IV, VI: EOMI without ptosis or diploplia.  V: Facial sensation is symmetric to temperature VII: Facial movement with right facial weakness VIII: hearing is intact to voice X: Uvula elevates symmetrically XI: Shoulder shrug is symmetric. XII: tongue is midline without atrophy or fasciculations.  Motor: She has no drift in any extremity  Sensory: Sensation is diminished bilaterally on the face, associate with paresthesias in both arms Cerebellar: No clear ataxia on finger-nose-finger  ASSESSMENT/PLAN [name] is a 22 y.o. 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  Treated at [facility] in [place] and started on eliquis on discharge 6/4 CTV 6/6 Extensive thrombosis of the superior sagittal sinus extending into the transverse and sigmoid sinuses, as well as the upper left internal jugular vein. Possible small amount of thrombus in the right sigmoid sinus. 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 Small volume  nonocclusive thrombus within the right sigmoid sinus. 2D Echo 65-70% HgbA1c- 5.6 LDL 171  VTE prophylaxis - Heparin IV Eliquis (apixaban) daily prior to admission, now on heparin IV. Pt CVST is stable and no failure of eliquis, will resume eliquis Therapy recommendations:  Pending Disposition:  Pending   Headaches  Pt denies hx of migraine Started on 5/24 CTA 5/27 did not report CVST Continued HA, admitted on 6/2 in [place] with repeat CTA showing CVST Discharged on 6/4 with improved HA but not resolved, d/c with sumatriptan, Tylenol and Fioricet  Continued HA since discharge, no change over time 6/8 with associated b/l hand and arm numbness, denies weakness Concern for increased ICP, fundi exam with blurry optic disc bilaterally Starting diamox 250mg  bid Diamox information and possible adverse effects given to family per their request Recommend following with neurology and ophthalmology after discharge  Hx of PE and DVT Occurred in 04/2021  Was on eliquis for 6 months Etiology concerning from nephrotic syndrome and autoimmune disorder but not confirmed Following with hematology  Nephrotic syndrome Autoimmune disorder 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 MD to update note as needed.   Elmer Picker, DNP, FNP-BC [facility] Pager: [phone]  ATTENDING NOTE: I reviewed above note and agree with the  assessment and plan. Pt was seen and examined.   Parents are at bedside.  Patient sitting at the edge of bed, still complaining of headache, more at the top of her head and behind eyes.  Stated that bilateral arm and hand numbness tingling has resolved.  Fundus exam showed bilateral optic disc blurry margin, concerning for increased intracranial pressure.  Otherwise no focal deficit.  Started on Diamox 250 twice daily.  MRI no acute infarct.  Switch heparin IV back to Eliquis. I had long discussion with parents and patient at bedside, updated pt current condition, treatment plan and potential prognosis, and answered all the questions.  They expressed understanding and appreciation. Discussed with Dr. Maryfrances Bunnell.   For detailed assessment and plan, please refer to above/below as I have made changes wherever appropriate.   Marvel Plan, MD PhD Stroke Neurology 04/27/2023 6:41 PM  I spent extensive face-to-face time with the patient and her parents, more than 50% of which was spent in counseling and coordination of care, reviewing test results, images and medication, and discussing the diagnosis, treatment plan and potential prognosis. This patient's care requiresreview of multiple databases, neurological assessment, discussion with family, other specialists and medical decision making of high complexity.      To contact Stroke Continuity provider, please refer to [email or web address]. After hours, contact General Neurology

## 2023-04-27 NOTE — H&P (Signed)
History and Physical    Molly Wise ZOX:[id number] DOB: 2001/09/30 DOA: 04/26/2023  PCP: Deatra James, MD   Patient coming from: Home   Chief Complaint: Numbness, tingling, headache   HPI: Molly Wise is a 22 y.o. 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  She was admitted to [facility] on 04/20/2023 with headache, right-sided numbness, and right facial droop.  She was found to have a dural venous sinus thrombosis and was started on IV heparin.  She was seen by hematology and neurology and discharged on Eliquis.  Yesterday, she developed new left-sided numbness and recurrent headache, prompting her presentation to the ED. She reports strict adherence with Eliquis.  ED Course: Upon arrival to the ED, patient is found to be afebrile and saturating well on room air with stable blood pressure.  EKG demonstrates sinus rhythm with sinus arrhythmia.  No acute findings noted on head CT.  MRI brain is negative for acute intracranial abnormality but MRV reveals persistent dural venous sinus thrombosis with overall appearance similar to prior study.  Neurology was consulted by the ED physician and the patient was started on IV heparin  Review of Systems:  All other systems reviewed and apart from HPI, are negative.  Past Medical History:  Diagnosis Date   Chronic kidney disease    DVT (deep venous thrombosis) (HCC)     Past Surgical History:  Procedure Laterality Date   RENAL BIOPSY Left 2022    Social History:   reports that she has never smoked. She has never used smokeless tobacco. She reports that she does not drink alcohol and does not use drugs.  No Known Allergies  Family History  Problem Relation Age of Onset   Graves' disease Mother     Diabetes Father    Cancer Maternal Grandmother    Cancer - Lung Paternal Grandmother        smoked   Cancer Paternal Grandmother    Diabetes Paternal Grandfather    Hyperlipidemia Paternal Grandfather    Kidney disease Paternal Grandfather      Prior to Admission medications   Medication Sig Start Date End Date Taking? Authorizing Provider  apixaban (ELIQUIS) 2.5 MG TABS tablet Take 1 tablet (2.5 mg total) by mouth 2 (two) times daily. 10/04/22   Rachel Moulds, MD  atorvastatin (LIPITOR) 20 MG tablet Take 20 mg by mouth daily. Patient not taking: Reported on 07/03/2022 12/19/21   [provider]  dapagliflozin propanediol (FARXIGA) 10 MG TABS tablet Take 10 mg by mouth daily. Patient not taking: Reported on 07/03/2022    [provider]  Empagliflozin (JARDIANCE PO) Take 10 mg by mouth daily.    [provider]  FEROSUL 325 (65 Fe) MG tablet Take 325 mg by mouth daily with breakfast. 03/28/21   [provider]  losartan (COZAAR) 25 MG tablet Take 50 mg by mouth 2 (two) times daily. 08/15/21   [provider]  losartan (COZAAR) 50 MG tablet Take 50 mg by mouth 2 (two) times daily.    [provider]  metoprolol tartrate (LOPRESSOR) 50 MG tablet Take 1 tablet by mouth once for procedure. Patient not taking: Reported on 07/03/2022 04/16/22   Sande Rives, MD  mycophenolate (CELLCEPT) 500 MG tablet Take 500 mg by mouth 2 (two) times daily.  [provider]  predniSONE (DELTASONE) 50 MG tablet Take 60 mg by mouth daily with breakfast. Taper dose as ordered    [provider]    Physical Exam: Vitals:   04/26/23 2345 04/27/23 0000 04/27/23 0124 04/27/23 0130  BP: 135/75 131/88 133/89 138/89  Pulse: (!) 59 60 (!) 56 (!) 57  Resp: 13 14 14 16   Temp:      TempSrc:      SpO2: 100% 100% 100% 100%  Weight:      Height:         Constitutional: NAD, no pallor or diaphoresis  Eyes: PERTLA, lids and conjunctivae  normal ENMT: Mucous membranes are moist. Posterior pharynx clear of any exudate or lesions.   Neck: supple, no masses  Respiratory: no wheezing, no crackles. No accessory muscle use.  Cardiovascular: S1 & S2 heard, regular rate and rhythm. No JVD. Abdomen: No distension, no tenderness, soft. Bowel sounds active.  Musculoskeletal: no clubbing / cyanosis. No joint deformity upper and lower extremities.   Skin: no significant rashes, lesions, ulcers. Warm, dry, well-perfused. Neurologic: Right facial weakness, CN 2-12 grossly intact otherwise. Sensation to light touch diminished in face and UEs bilaterally. Strength 5/5 in all 4 limbs. Alert and oriented.    Labs and Imaging on Admission: I have personally reviewed following labs and imaging studies  CBC: Recent Labs  Lab 04/24/23 1434 04/26/23 2158  WBC  --  10.2  NEUTROABS  --  7.2  HGB 11.9* 12.4  HCT 35.0* 40.3  MCV  --  83.3  PLT  --  403*   Basic Metabolic Panel: Recent Labs  Lab 04/24/23 1434 04/26/23 2158  NA 141 138  K 3.5 3.5  CL 107 106  CO2  --  19*  GLUCOSE 100* 108*  BUN 9 12  CREATININE 0.80 0.87  CALCIUM  --  8.8*   GFR: Estimated Creatinine Clearance: 128.1 mL/min (by C-G formula based on SCr of 0.87 mg/dL). Liver Function Tests: Recent Labs  Lab 04/26/23 2158  AST 13*  ALT 14  ALKPHOS 64  BILITOT <0.1*  PROT 6.2*  ALBUMIN 2.7*   No results for input(s): "LIPASE", "AMYLASE" in the last 168 hours. No results for input(s): "AMMONIA" in the last 168 hours. Coagulation Profile: Recent Labs  Lab 04/26/23 2158  INR 1.1   Cardiac Enzymes: No results for input(s): "CKTOTAL", "CKMB", "CKMBINDEX", "TROPONINI" in the last 168 hours. BNP (last 3 results) No results for input(s): "PROBNP" in the last 8760 hours. HbA1C: No results for input(s): "HGBA1C" in the last 72 hours. CBG: No results for input(s): "GLUCAP" in the last 168 hours. Lipid Profile: No results for input(s): "CHOL", "HDL",  "LDLCALC", "TRIG", "CHOLHDL", "LDLDIRECT" in the last 72 hours. Thyroid Function Tests: No results for input(s): "TSH", "T4TOTAL", "FREET4", "T3FREE", "THYROIDAB" in the last 72 hours. Anemia Panel: No results for input(s): "VITAMINB12", "FOLATE", "FERRITIN", "TIBC", "IRON", "RETICCTPCT" in the last 72 hours. Urine analysis:    Component Value Date/Time   COLORURINE YELLOW 04/26/2023 2202   APPEARANCEUR HAZY (A) 04/26/2023 2202   LABSPEC 1.033 (H) 04/26/2023 2202   PHURINE 5.0 04/26/2023 2202   GLUCOSEU >=500 (A) 04/26/2023 2202   HGBUR LARGE (A) 04/26/2023 2202   BILIRUBINUR NEGATIVE 04/26/2023 2202   KETONESUR NEGATIVE 04/26/2023 2202   PROTEINUR >=300 (A) 04/26/2023 2202   NITRITE NEGATIVE 04/26/2023 2202   LEUKOCYTESUR NEGATIVE 04/26/2023 2202   Sepsis Labs: @LABRCNTIP (procalcitonin:4,lacticidven:4) )No results found for this or any previous visit (from the  past 240 hour(s)).   Radiological Exams on Admission: MR BRAIN WO CONTRAST  Result Date: 04/27/2023 CLINICAL DATA:  Initial evaluation for dural venous sinus thrombosis, neuro deficit EXAM: MRI HEAD WITHOUT CONTRAST MRV HEAD WITHOUT AND WITH CONTRAST TECHNIQUE: Multiplanar, multiecho pulse sequences of the brain and surrounding structures were obtained without and with intravenous contrast. Angiographic images of the intracranial venous structures were obtained using MRV technique without intravenous contrast. CONTRAST:  10mL GADAVIST GADOBUTROL 1 MMOL/ML IV SOLN COMPARISON:  Prior study from earlier the same day. FINDINGS: MRI BRAIN FINDINGS Brain: Cerebral volume within normal limits for age. No focal parenchymal signal abnormality. No abnormal foci of restricted diffusion to suggest acute or subacute ischemia. Gray-white matter differentiation well maintained. No encephalomalacia to suggest chronic cortical infarction or other insult. No foci of susceptibility artifact indicative of acute or chronic intracranial blood products.  Diffuse prominence of the intracranial vasculature on SWI sequence, consistent with venous congestion related to dural venous sinus thrombosis. No mass lesion, midline shift or mass effect. Ventricles normal in size and morphology without hydrocephalus. No extra-axial fluid collection. Pituitary gland and suprasellar region within normal limits. Vascular: Major intracranial arterial vascular flow voids are maintained. Findings consistent with persistent dural venous sinus thrombosis, described on corresponding MRV. Skull and upper cervical spine: Craniocervical junction within normal limits. Diffuse loss of normal bone marrow signal, nonspecific but can be seen with anemia, smoking, obesity, and infiltrative/myelofibrotic marrow processes. No focal marrow replacing lesion. No scalp soft tissue abnormality. Sinuses/Orbits: Globes orbital soft tissues demonstrate no acute finding. Paranasal sinuses are largely clear. Small left mastoid effusion noted. Other: None. MRV HEAD FINDINGS Persistent fairly extensive filling defects seen throughout the majority of the superior sagittal sinus to the torcula. Filling defect extends to involve the left transverse and sigmoid sinuses as well as the left jugular bulb and proximal left internal jugular vein. Right transverse sinus patent. Small volume nonocclusive thrombus within the right sigmoid sinus (series 3, image 51). Right jugular bulb and proximal right internal jugular vein are patent. Straight sinus, vein of Galen, and internal cerebral veins are patent. No appreciable cortical vein thrombosis. No appreciable abnormality about the cavernous sinus. Overall, appearance is similar to prior. IMPRESSION: 1. 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 Small volume nonocclusive thrombus within the right  sigmoid sinus. Overall, appearance is similar to prior. To involve the left transverse and sigmoid sinuses as well as the left jugular bulb and proximal left internal jugular vein. 2. Otherwise normal brain MRI. No other acute intracranial abnormality. Electronically Signed   By: Rise Mu M.D.   On: 04/27/2023 02:21   MR MRV HEAD W WO CONTRAST  Result Date: 04/27/2023 CLINICAL DATA:  Initial evaluation for dural venous sinus thrombosis, neuro deficit EXAM: MRI HEAD WITHOUT CONTRAST MRV HEAD WITHOUT AND WITH CONTRAST TECHNIQUE: Multiplanar, multiecho pulse sequences of the brain and surrounding structures were obtained without and with intravenous contrast. Angiographic images of the intracranial venous structures were obtained using MRV technique without intravenous contrast. CONTRAST:  10mL GADAVIST GADOBUTROL 1 MMOL/ML IV SOLN COMPARISON:  Prior study from earlier the same day. FINDINGS: MRI BRAIN FINDINGS Brain: Cerebral volume within normal limits for age. No focal parenchymal signal abnormality. No abnormal foci of restricted diffusion to suggest acute or subacute ischemia. Gray-white matter differentiation well maintained. No encephalomalacia to suggest chronic cortical infarction  or other insult. No foci of susceptibility artifact indicative of acute or chronic intracranial blood products. Diffuse prominence of the intracranial vasculature on SWI sequence, consistent with venous congestion related to dural venous sinus thrombosis. No mass lesion, midline shift or mass effect. Ventricles normal in size and morphology without hydrocephalus. No extra-axial fluid collection. Pituitary gland and suprasellar region within normal limits. Vascular: Major intracranial arterial vascular flow voids are maintained. Findings consistent with persistent dural venous sinus thrombosis, described on corresponding MRV. Skull and upper cervical spine: Craniocervical junction within normal limits. Diffuse loss of  normal bone marrow signal, nonspecific but can be seen with anemia, smoking, obesity, and infiltrative/myelofibrotic marrow processes. No focal marrow replacing lesion. No scalp soft tissue abnormality. Sinuses/Orbits: Globes orbital soft tissues demonstrate no acute finding. Paranasal sinuses are largely clear. Small left mastoid effusion noted. Other: None. MRV HEAD FINDINGS Persistent fairly extensive filling defects seen throughout the majority of the superior sagittal sinus to the torcula. Filling defect extends to involve the left transverse and sigmoid sinuses as well as the left jugular bulb and proximal left internal jugular vein. Right transverse sinus patent. Small volume nonocclusive thrombus within the right sigmoid sinus (series 3, image 51). Right jugular bulb and proximal right internal jugular vein are patent. Straight sinus, vein of Galen, and internal cerebral veins are patent. No appreciable cortical vein thrombosis. No appreciable abnormality about the cavernous sinus. Overall, appearance is similar to prior. IMPRESSION: 1. 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 Small volume nonocclusive thrombus within the right sigmoid sinus. Overall, appearance is similar to prior. To involve the left transverse and sigmoid sinuses as well as the left jugular bulb and proximal left internal jugular vein. 2. Otherwise normal brain MRI. No other acute intracranial abnormality. Electronically Signed   By: Rise Mu M.D.   On: 04/27/2023 02:21   CT HEAD CODE STROKE WO CONTRAST  Result Date: 04/26/2023 CLINICAL DATA:  Code stroke. Initial evaluation for neuro deficit, stroke. EXAM: CT HEAD WITHOUT CONTRAST TECHNIQUE: Contiguous axial images were obtained from the base of the skull through the vertex without intravenous  contrast. RADIATION DOSE REDUCTION: This exam was performed according to the departmental dose-optimization program which includes automated exposure control, adjustment of the mA and/or kV according to patient size and/or use of iterative reconstruction technique. COMPARISON:  Prior study from 04/24/2023. FINDINGS: Brain: Cerebral volume within normal limits. No acute intracranial hemorrhage. No acute large vessel territory infarct. No complication related to recently identified dural venous sinus thrombosis identified. No mass lesion or midline shift. No hydrocephalus or extra-axial fluid collection. Vascular: No abnormal hyperdense vessel. Previously identified dural venous sinus thrombosis better evaluated on recent CT venogram. Skull: Scalp soft tissues and calvarium demonstrate no acute finding. Sinuses/Orbits: Globes orbital soft tissues within normal limits. Small right sphenoid sinus retention cyst. Paranasal sinuses are otherwise clear. No mastoid effusion. Other: None. ASPECTS [facility] Stroke Program Early CT Score) - Ganglionic level infarction (caudate, lentiform nuclei, internal capsule, insula, M1-M3 cortex): 7 - Supraganglionic infarction (M4-M6 cortex): 3 Total score (0-10 with 10 being normal): 10 IMPRESSION: 1. No acute intracranial abnormality. No intracranial hemorrhage or other complication related to recently identified dural venous sinus thrombosis. 2. ASPECTS is 10. These results were communicated to Dr. Amada Jupiter at 11:29 pm on 04/26/2023 by text page via the [facility] messaging system. Electronically Signed  By: Rise Mu M.D.   On: 04/26/2023 23:31    EKG: Independently reviewed. Sinus rhythm with sinus arrhythmia.   Assessment/Plan   1. Dural venous sinus thrombosis  - Continue IV heparin, neuro checks, consider transition to Pradaxa    2. IgA glomerulonephritis  - Continue Cellcept and prednisone   3. Hypertension  - Continue losartan    DVT prophylaxis: IV  heparin  Code Status: Full  Level of Care: Level of care: Telemetry Medical Family Communication: Mother and father at bedside   Disposition Plan:  Patient is from: home  Anticipated d/c is to: home  Anticipated d/c date is: 04/28/23  Patient currently: Pending stable neuro exam and transition off of IV heparin infusion  Consults called: Neurology  Admission status: Observation     Briscoe Deutscher, MD [facility]  04/27/2023, 2:37 AM

## 2023-04-27 NOTE — Progress Notes (Signed)
    Patient: SHARLEE RUFINO UJW:[id number] DOB: 2001/06/23      Brief hospital course: Ms Emminger is a 22 y.o. 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    This is a no charge note, for further details, please see the H&P by my partner, Dr. Antionette Char from earlier today.   Principal Problem:   Dural venous sinus thrombosis Active Problems:   Essential hypertension, benign   Glomerulonephritis, IgA   Mixed hyperlipidemia   Morbid obesity (HCC)    Continue heparin for now Consult Neurology, appreciate cares Diamox        Physical Exam: BP 133/72 (BP Location: Left Arm)   Pulse 84   Temp 98.9 F (37.2 C) (Oral)   Resp 20   Ht 5\' 6"  (1.676 m)   Wt 111.1 kg   LMP 04/22/2023   SpO2 100%   BMI 39.54 kg/m   Patient seen and examined.              Author: Alberteen Sam, MD 04/27/2023 3:29 PM

## 2023-04-27 NOTE — Progress Notes (Addendum)
ANTICOAGULATION CONSULT NOTE - Follow Up Consult  Pharmacy Consult for heparin Indication:  cerebral venous sinus thrombosis  No Known Allergies  Patient Measurements: Height: 5\' 6"  (167.6 cm) Weight: 111.1 kg (245 lb) IBW/kg (Calculated) : 59.3 Heparin Dosing Weight: 85kg  Vital Signs: Temp: 98.8 F (37.1 C) (06/09 0720) Temp Source: Oral (06/09 0720) BP: 119/70 (06/09 0800) Pulse Rate: 65 (06/09 0800)  Labs: Recent Labs    04/24/23 1434 04/26/23 2158 04/27/23 0532 04/27/23 0857  HGB 11.9* 12.4 11.7*  --   HCT 35.0* 40.3 38.0  --   PLT  --  403* 439*  --   APTT  --  27  --  49*  LABPROT  --  14.2  --   --   INR  --  1.1  --   --   HEPARINUNFRC  --   --   --  >1.10*  CREATININE 0.80 0.87 0.76  --      Estimated Creatinine Clearance: 139.3 mL/min (by C-G formula based on SCr of 0.76 mg/dL).   Medical History: Past Medical History:  Diagnosis Date   Chronic kidney disease    DVT (deep venous thrombosis) (HCC)     Assessment: 22yo female was discharged 6/4 from [facility] after stay for a large-volume thrombus within superior sagittal sinus, bilateral transverse sinuses, and sigmoid sinuses >> started on UFH>LMWH>DOAC, and now presents to ED c/o numbness/weakness similar to her initial sx as well as nausea and HA >> to transition from apixaban to UFH during admission for observation while neuro considers switching to dabigatran.  Pt currently taking apixaban 10mg  BID with last dose taken 6/8 10a.  aPTT subtherapeutic at 49, heparin level > 1.1 from recent Eliquis while on 1500u/hr heparin. Hgb 11.7 and PLT 439. No bleeding noted.   Goal of Therapy:  Heparin level 0.3-0.7 units/ml aPTT 66-102 seconds Monitor platelets by anticoagulation protocol: Yes   Plan:  Will give 1500u bolus.  Increase heparin to 1700 u/hr.  Obtain 6 hour aPTT (while apixaban affects anti-Xa assay), and CBC. F/u transition to DOAC   Estill Batten, PharmD, BCCCP  04/27/2023,11:02 AM

## 2023-04-27 NOTE — ED Notes (Signed)
Patient left the floor in stable condition, AOX4, with transport to [facility].  Family at bedside and notified that the room was ready.

## 2023-04-27 NOTE — Progress Notes (Signed)
ANTICOAGULATION CONSULT NOTE - Initial Consult  Pharmacy Consult for heparin Indication:  cerebral venous sinus thrombosis  No Known Allergies  Patient Measurements: Height: 5\' 6"  (167.6 cm) Weight: 111.1 kg (245 lb) IBW/kg (Calculated) : 59.3 Heparin Dosing Weight: 85kg  Vital Signs: Temp: 98.5 F (36.9 C) (06/08 2128) Temp Source: Oral (06/08 2128) BP: 131/88 (06/09 0000) Pulse Rate: 60 (06/09 0000)  Labs: Recent Labs    04/24/23 1434 04/26/23 2158  HGB 11.9* 12.4  HCT 35.0* 40.3  PLT  --  403*  APTT  --  27  LABPROT  --  14.2  INR  --  1.1  CREATININE 0.80 0.87    Estimated Creatinine Clearance: 128.1 mL/min (by C-G formula based on SCr of 0.87 mg/dL).   Medical History: Past Medical History:  Diagnosis Date   Chronic kidney disease    DVT (deep venous thrombosis) (HCC)     Assessment: 22yo female was discharged 6/4 from [facility] after stay for a large-volume thrombus within superior sagittal sinus, bilateral transverse sinuses, and sigmoid sinuses >> started on UFH>LMWH>DOAC, and now presents to ED c/o numbness/weakness similar to her initial sx as well as nausea and HA >> to transition from apixaban to UFH during admission for observation while neuro considers switching to dabigatran.  Pt currently taking apixaban 10mg  BID with last dose taken 6/8 10a.  Goal of Therapy:  Heparin level 0.3-0.7 units/ml aPTT 66-102 seconds Monitor platelets by anticoagulation protocol: Yes   Plan:  Being heparin infusion at 1500 units/hr. Monitor heparin levels, aPTT (while apixaban affects anti-Xa assay), and CBC.  Vernard Gambles, PharmD, BCPS  04/27/2023,12:11 AM

## 2023-04-27 NOTE — Hospital Course (Addendum)
Molly Wise is a 22 y.o. 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

## 2023-04-27 NOTE — Progress Notes (Signed)
ANTICOAGULATION CONSULT NOTE   Pharmacy Consult for heparin > apixaban Indication:  cerebral venous sinus thrombosis  No Known Allergies  Patient Measurements: Height: 5\' 6"  (167.6 cm) Weight: 111.1 kg (245 lb) IBW/kg (Calculated) : 59.3 Heparin Dosing Weight: 85kg  Vital Signs: Temp: 98.9 F (37.2 C) (06/09 1450) Temp Source: Oral (06/09 1450) BP: 133/72 (06/09 1450) Pulse Rate: 84 (06/09 1450)  Labs: Recent Labs    04/26/23 2158 04/27/23 0532 04/27/23 0857  HGB 12.4 11.7*  --   HCT 40.3 38.0  --   PLT 403* 439*  --   APTT 27  --  49*  LABPROT 14.2  --   --   INR 1.1  --   --   HEPARINUNFRC  --   --  >1.10*  CREATININE 0.87 0.76  --      Estimated Creatinine Clearance: 139.3 mL/min (by C-G formula based on SCr of 0.76 mg/dL).   Medical History: Past Medical History:  Diagnosis Date   Chronic kidney disease    DVT (deep venous thrombosis) (HCC)     Assessment: 22yo female was discharged 6/4 from [facility] after stay for a large-volume thrombus within superior sagittal sinus, bilateral transverse sinuses, and sigmoid sinuses >> started on UFH>LMWH>DOAC, and now presents to ED c/o numbness/weakness similar to her initial sx as well as nausea and HA >> to transition from apixaban to UFH during admission for observation while neuro considers switching to dabigatran.  Pt currently taking apixaban 10mg  BID with last dose taken 6/8 10a.  Discussed with neurology, plan to change back to apixaban tonight. Will restart loading dose period. Hgb 11.7, plt 439. No s/sx of bleeding.   Goal of Therapy:  Heparin level 0.3-0.7 units/ml aPTT 66-102 seconds Monitor platelets by anticoagulation protocol: Yes   Plan:  Stop heparin infusion at time of apixaban administration Start apixaban 10 mg BID for 7 days then reduce to 5 mg BID thereafter Monitor s/sx of bleeding and CBC  Thank you for allowing pharmacy to participate in this patient's care,  Sherron Monday, PharmD,  BCCCP Clinical Pharmacist  Phone: [phone] 04/27/2023 4:38 PM  Please check AMION for all [facility] phone numbers After 10:00 PM, call Main Pharmacy [phone]

## 2023-04-28 ENCOUNTER — Other Ambulatory Visit (HOSPITAL_COMMUNITY): Payer: Self-pay

## 2023-04-28 DIAGNOSIS — G08 Intracranial and intraspinal phlebitis and thrombophlebitis: Secondary | ICD-10-CM | POA: Diagnosis not present

## 2023-04-28 LAB — CBC
HCT: 36.6 % (ref 36.0–46.0)
Hemoglobin: 11.3 g/dL — ABNORMAL LOW (ref 12.0–15.0)
MCH: 26.1 pg (ref 26.0–34.0)
MCHC: 30.9 g/dL (ref 30.0–36.0)
MCV: 84.5 fL (ref 80.0–100.0)
Platelets: 413 10*3/uL — ABNORMAL HIGH (ref 150–400)
RBC: 4.33 MIL/uL (ref 3.87–5.11)
RDW: 14.7 % (ref 11.5–15.5)
WBC: 11.9 10*3/uL — ABNORMAL HIGH (ref 4.0–10.5)
nRBC: 0 % (ref 0.0–0.2)

## 2023-04-28 LAB — HIV ANTIBODY (ROUTINE TESTING W REFLEX)
HIV Screen 4th Generation wRfx: NONREACTIVE
HIV Screen 4th Generation wRfx: NONREACTIVE

## 2023-04-28 LAB — BASIC METABOLIC PANEL
Anion gap: 10 (ref 5–15)
BUN: 15 mg/dL (ref 6–20)
CO2: 18 mmol/L — ABNORMAL LOW (ref 22–32)
Calcium: 8.4 mg/dL — ABNORMAL LOW (ref 8.9–10.3)
Chloride: 108 mmol/L (ref 98–111)
Creatinine, Ser: 0.92 mg/dL (ref 0.44–1.00)
GFR, Estimated: >60 mL/min (ref >60)
Glucose, Bld: 107 mg/dL — ABNORMAL HIGH (ref 70–99)
Potassium: 3.9 mmol/L (ref 3.5–5.1)
Sodium: 136 mmol/L (ref 135–145)

## 2023-04-28 LAB — HEPARIN LEVEL (UNFRACTIONATED): Heparin Unfractionated: >1.1 IU/mL — ABNORMAL HIGH (ref 0.30–0.70)

## 2023-04-28 LAB — APTT: aPTT: 26 seconds (ref 24–36)

## 2023-04-28 MED ORDER — METOCLOPRAMIDE HCL 5 MG/ML IJ SOLN
10.0000 mg | Freq: Once | INTRAMUSCULAR | Status: AC
  Administered 2023-04-28: 10 mg via INTRAVENOUS
  Filled 2023-04-28: qty 2

## 2023-04-28 MED ORDER — BUTALBITAL-APAP-CAFFEINE 50-325-40 MG PO TABS
1.0000 | ORAL_TABLET | Freq: Four times a day (QID) | ORAL | 0 refills | Status: AC | PRN
  Filled 2023-04-28: qty 14, 4d supply, fill #0

## 2023-04-28 MED ORDER — ACETAZOLAMIDE 250 MG PO TABS
250.0000 mg | ORAL_TABLET | Freq: Two times a day (BID) | ORAL | 0 refills | Status: DC
  Filled 2023-04-28: qty 14, 7d supply, fill #0

## 2023-04-28 MED ORDER — BUTALBITAL-APAP-CAFFEINE 50-325-40 MG PO TABS: 1.0000 | ORAL_TABLET | Freq: Four times a day (QID) | ORAL | Status: DC | PRN

## 2023-04-28 MED ORDER — KETOROLAC TROMETHAMINE 30 MG/ML IJ SOLN
30.0000 mg | Freq: Once | INTRAMUSCULAR | Status: AC
  Administered 2023-04-28: 30 mg via INTRAVENOUS
  Filled 2023-04-28: qty 1

## 2023-04-28 MED ORDER — ROSUVASTATIN CALCIUM 5 MG PO TABS
10.0000 mg | ORAL_TABLET | Freq: Every day | ORAL | Status: DC
  Filled 2023-04-28: qty 2

## 2023-04-28 MED ORDER — APIXABAN 5 MG PO TABS: 5.0000 mg | ORAL_TABLET | ORAL | 0 refills | Status: DC

## 2023-04-28 NOTE — Discharge Summary (Addendum)
CASY TAVANO WJX:[id number] DOB: 10/12/2001 DOA: 04/26/2023  PCP: Deatra James, MD  Admit date: 04/26/2023  Discharge date: 04/28/2023  Admitted From: Home   Disposition:  Home   Recommendations for Outpatient Follow-up:   Follow up with PCP in 1-2 weeks  PCP Please obtain BMP/CBC, 2 view CXR in 1week,  (see Discharge instructions)   PCP Please follow up on the following pending results: PCP to arrange for outpatient hematology and neurology follow-up.  Monitor BMP closely.   Home Health: Outpt PT if she qualifies   Equipment/Devices: None  Consultations: NEuro Discharge Condition: Stable    CODE STATUS: Full    Diet Recommendation: Heart Healthy     Chief Complaint  Patient presents with   Numbness     Brief history of present illness from the day of admission and additional interim summary     22 y.o. 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   She was admitted to [facility] on 04/20/2023 with headache, right-sided numbness, and right facial droop.  She was found to have a dural venous sinus thrombosis and was started on IV heparin.  She was seen by hematology and neurology and discharged on Eliquis.   The day before hospital admission, she developed new left-sided numbness and recurrent headache, prompting her presentation to the ED. She reports strict adherence with Eliquis.                                                                 Hospital Course   Headaches in the setting of recent dural venous sinus thrombosis.  Seen by neurology, head imaging stable, dural venous  sinus clot is stable, headache likely due to the recent dural venous sinus clot, some left-sided heaviness, case discussed with neurology team Dr.Xu in detail on 04/28/2023, patient is responding well to medical treatment, her clots are stable and as expected within 1 week of recent diagnosis, continue Eliquis, continue Fioricet and Tylenol for headaches, 1 week of oral Diamox to see if it makes any improvement in her headaches.  Patient and family told that headaches can continue for the first 2 to 3 weeks with gradual improvement, she was seen by PT OT cleared.  Will try to get outpatient PT if she qualifies, she is walking in the hallway without any distress, post discharge follow-up with PCP and her neurologist, she claims she is already following with a hematologist.    Plan discussed with patient and patient's mother x 3 today.   History of DVT PE, now dural venous sinus thrombosis.  Hypercoagulable state.  Also underlying GN nephropathy.  Treatment as above,  follow-up with PCP and hematology postdischarge.  Eliquis as above.  She will continue her previous Eliquis starter pack unchanged.  IgA nephropathy.  Continue CellCept and prednisone as before.  Hypertension.  Continue home regimen.  Dyslipidemia.  Offered Crestor but she wants to hold off and discuss with PCP.  Obesity.  BMI 39.  Follow-up with PCP for weight loss.  Discharge diagnosis     Principal Problem:   Dural venous sinus thrombosis Active Problems:   Essential hypertension, benign   Glomerulonephritis, IgA   Mixed hyperlipidemia   Morbid obesity (HCC)    Discharge instructions    Discharge Instructions     Diet - low sodium heart healthy   Complete by: As directed    Discharge instructions   Complete by: As directed    Follow with Primary MD Deatra James, MD in 7 days to also follow-up with your neurologist within a week of discharge.  Get CBC, CMP  -  checked next visit with your primary MD   Activity: As  tolerated with Full fall precautions use walker/cane & assistance as needed  Disposition Home    Diet: Heart Healthy    Special Instructions: If you have smoked or chewed Tobacco  in the last 2 yrs please stop smoking, stop any regular Alcohol  and or any Recreational drug use.  On your next visit with your primary care physician please Get Medicines reviewed and adjusted.  Please request your Prim.MD to go over all Hospital Tests and Procedure/Radiological results at the follow up, please get all Hospital records sent to your Prim MD by signing hospital release before you go home.  If you experience worsening of your admission symptoms, develop shortness of breath, life threatening emergency, suicidal or homicidal thoughts you must seek medical attention immediately by calling 911 or calling your MD immediately  if symptoms less severe.  You Must read complete instructions/literature along with all the possible adverse reactions/side effects for all the Medicines you take and that have been prescribed to you. Take any new Medicines after you have completely understood and accpet all the possible adverse reactions/side effects.   Do not drive when taking Pain medications.  Do not take more than prescribed Pain, Sleep and Anxiety Medications   Increase activity slowly   Complete by: As directed        Discharge Medications   Allergies as of 04/28/2023   No Known Allergies      Medication List     STOP taking these medications    Lupkynis 7.9 MG Caps Generic drug: Voclosporin   methocarbamol 500 MG tablet Commonly known as: ROBAXIN   metoCLOPramide 10 MG tablet Commonly known as: REGLAN   metoprolol tartrate 50 MG tablet Commonly known as: LOPRESSOR   SUMAtriptan 25 MG tablet Commonly known as: IMITREX       TAKE these medications    acetaminophen 500 MG tablet Commonly known as: TYLENOL Take 1,000 mg by mouth every 6 (six) hours as needed for moderate pain.    acetaZOLAMIDE 250 MG tablet Commonly known as: DIAMOX Take 1 tablet (250 mg total) by mouth 2 (two) times daily.   apixaban 5 MG Tabs tablet Commonly known as: Eliquis Take 1-2 tablets (5-10 mg total) by mouth See admin instructions. Take 10 mg by mouth twice daily for a total of 7 days. Then switch to taking 5 mg twice daily starting day 8. What changed: additional instructions   butalbital-acetaminophen-caffeine 50-325-40 MG tablet Commonly known as: FIORICET  Take 1 tablet by mouth every 6 (six) hours as needed for up to 10 days for headache. What changed:  how much to take when to take this reasons to take this   FeroSul 325 (65 FE) MG tablet Generic drug: ferrous sulfate Take 325 mg by mouth daily with breakfast.   Jardiance 10 MG Tabs tablet Generic drug: empagliflozin Take 10 mg by mouth daily.   losartan 50 MG tablet Commonly known as: COZAAR Take 50 mg by mouth in the morning and at bedtime.   mycophenolate 250 MG capsule Commonly known as: CELLCEPT Take 1,500 mg by mouth 2 (two) times daily. What changed: Another medication with the same name was removed. Continue taking this medication, and follow the directions you see here.   predniSONE 5 MG tablet Commonly known as: DELTASONE Take 10 mg by mouth daily with breakfast.   Vitamin D (Ergocalciferol) 1.25 MG (50000 UNIT) Caps capsule Commonly known as: DRISDOL Take 50,000 Units by mouth every 7 (seven) days.         Follow-up Information     Deatra James, MD. Schedule an appointment as soon as possible for a visit in 1 week(s).   Specialty: Family Medicine Why: And with your neurologist within a week. Contact information: [address] [place] [address] [phone]         [facility]. Schedule an appointment as soon as possible for a visit in 1 week(s).   Contact information: [address]     [address] [place]  [address] [phone]                Major procedures and Radiology Reports - PLEASE review detailed and final reports thoroughly  -      ECHOCARDIOGRAM COMPLETE  Result Date: 04/27/2023    ECHOCARDIOGRAM REPORT   Patient Name:   RASHETTA PLAYFORD Date of Exam: 04/27/2023 Medical Rec #:  [id number]          Height:       66.0 in Accession #:    [id number]         Weight:       245.0 lb Date of Birth:  23-Jan-2001           BSA:          2.180 m Patient Age:    22 years           BP:           112/85 mmHg Patient Gender: F                  HR:           77 bpm. Exam Location:  Inpatient Procedure: 2D Echo, Pediatric Echo, Color Doppler and Intracardiac Opacification            Agent Indications:    CVA  History:        Patient has no prior history of Echocardiogram examinations.                 Stroke; Risk Factors:Hypertension and Family History of Coronary                 Artery Disease.  Sonographer:    Melissa Morford RDCS (AE, PE) Referring Phys: [id number] JINDONG XU IMPRESSIONS  1. Left ventricular ejection fraction, by estimation, is 65 to 70%. The left ventricle has normal function. The left ventricle has no regional wall motion abnormalities. Left ventricular  diastolic parameters were normal.  2. Right ventricular systolic function is normal. The right ventricular size is normal. Tricuspid regurgitation signal is inadequate for assessing PA pressure.  3. The mitral valve is normal in structure. No evidence of mitral valve regurgitation. No evidence of mitral stenosis.  4. The aortic valve is tricuspid. Aortic valve regurgitation is not visualized. No aortic stenosis is present. FINDINGS  Left Ventricle: Left ventricular ejection fraction, by estimation, is 65 to 70%. The left ventricle has normal function. The left ventricle has no regional wall motion abnormalities. Definity contrast agent was given IV to delineate the left ventricular  endocardial borders. The left ventricular internal cavity  size was normal in size. There is no left ventricular hypertrophy. Left ventricular diastolic parameters were normal. Right Ventricle: The right ventricular size is normal. Right vetricular wall thickness was not well visualized. Right ventricular systolic function is normal. Tricuspid regurgitation signal is inadequate for assessing PA pressure. Left Atrium: Left atrial size was normal in size. Right Atrium: Right atrial size was normal in size. Pericardium: There is no evidence of pericardial effusion. Mitral Valve: The mitral valve is normal in structure. No evidence of mitral valve regurgitation. No evidence of mitral valve stenosis. Tricuspid Valve: The tricuspid valve is normal in structure. Tricuspid valve regurgitation is trivial. No evidence of tricuspid stenosis. Aortic Valve: The aortic valve is tricuspid. Aortic valve regurgitation is not visualized. No aortic stenosis is present. Aortic valve mean gradient measures 4.7 mmHg. Aortic valve peak gradient measures 10.6 mmHg. Aortic valve area, by VTI measures 2.45  cm. Pulmonic Valve: The pulmonic valve was not well visualized. Pulmonic valve regurgitation is not visualized. No evidence of pulmonic stenosis. Aorta: The aortic root and ascending aorta are structurally normal, with no evidence of dilitation. Venous: The inferior vena cava was not well visualized. IAS/Shunts: The interatrial septum was not well visualized.  LEFT VENTRICLE PLAX 2D LVIDd:         4.50 cm   Diastology LVIDs:         2.80 cm   LV e' medial:    7.62 cm/s LV PW:         1.10 cm   LV E/e' medial:  11.2 LV IVS:        0.90 cm   LV e' lateral:   10.60 cm/s LVOT diam:     2.10 cm   LV E/e' lateral: 8.1 LV SV:         83 LV SV Index:   38 LVOT Area:     3.46 cm  RIGHT VENTRICLE RV S prime:     12.30 cm/s TAPSE (M-mode): 2.5 cm LEFT ATRIUM             Index        RIGHT ATRIUM           Index LA diam:        4.10 cm 1.88 cm/m   RA Area:     14.00 cm LA Vol (A2C):   31.8 ml 14.59 ml/m   RA Volume:   30.80 ml  14.13 ml/m LA Vol (A4C):   22.8 ml 10.46 ml/m LA Biplane Vol: 29.3 ml 13.44 ml/m  AORTIC VALVE AV Area (Vmax):    2.89 cm AV Area (Vmean):   3.38 cm AV Area (VTI):     2.45 cm AV Vmax:           162.88 cm/s AV Vmean:  97.530 cm/s AV VTI:            0.340 m AV Peak Grad:      10.6 mmHg AV Mean Grad:      4.7 mmHg LVOT Vmax:         136.00 cm/s LVOT Vmean:        95.200 cm/s LVOT VTI:          0.240 m LVOT/AV VTI ratio: 0.71  AORTA Ao Root diam: 2.60 cm Ao Asc diam:  2.20 cm MITRAL VALVE MV Area (PHT): 4.31 cm    SHUNTS MV E velocity: 85.70 cm/s  Systemic VTI:  0.24 m MV A velocity: 63.00 cm/s  Systemic Diam: 2.10 cm MV E/A ratio:  1.36 Dina Rich MD Electronically signed by Dina Rich MD Signature Date/Time: 04/27/2023/2:44:20 PM    Final    MR BRAIN WO CONTRAST  Result Date: 04/27/2023 CLINICAL DATA:  Initial evaluation for dural venous sinus thrombosis, neuro deficit EXAM: MRI HEAD WITHOUT CONTRAST MRV HEAD WITHOUT AND WITH CONTRAST TECHNIQUE: Multiplanar, multiecho pulse sequences of the brain and surrounding structures were obtained without and with intravenous contrast. Angiographic images of the intracranial venous structures were obtained using MRV technique without intravenous contrast. CONTRAST:  10mL GADAVIST GADOBUTROL 1 MMOL/ML IV SOLN COMPARISON:  Prior study from earlier the same day. FINDINGS: MRI BRAIN FINDINGS Brain: Cerebral volume within normal limits for age. No focal parenchymal signal abnormality. No abnormal foci of restricted diffusion to suggest acute or subacute ischemia. Gray-white matter differentiation well maintained. No encephalomalacia to suggest chronic cortical infarction or other insult. No foci of susceptibility artifact indicative of acute or chronic intracranial blood products. Diffuse prominence of the intracranial vasculature on SWI sequence, consistent with venous congestion related to dural venous sinus thrombosis. No mass  lesion, midline shift or mass effect. Ventricles normal in size and morphology without hydrocephalus. No extra-axial fluid collection. Pituitary gland and suprasellar region within normal limits. Vascular: Major intracranial arterial vascular flow voids are maintained. Findings consistent with persistent dural venous sinus thrombosis, described on corresponding MRV. Skull and upper cervical spine: Craniocervical junction within normal limits. Diffuse loss of normal bone marrow signal, nonspecific but can be seen with anemia, smoking, obesity, and infiltrative/myelofibrotic marrow processes. No focal marrow replacing lesion. No scalp soft tissue abnormality. Sinuses/Orbits: Globes orbital soft tissues demonstrate no acute finding. Paranasal sinuses are largely clear. Small left mastoid effusion noted. Other: None. MRV HEAD FINDINGS Persistent fairly extensive filling defects seen throughout the majority of the superior sagittal sinus to the torcula. Filling defect extends to involve the left transverse and sigmoid sinuses as well as the left jugular bulb and proximal left internal jugular vein. Right transverse sinus patent. Small volume nonocclusive thrombus within the right sigmoid sinus (series 3, image 51). Right jugular bulb and proximal right internal jugular vein are patent. Straight sinus, vein of Galen, and internal cerebral veins are patent. No appreciable cortical vein thrombosis. No appreciable abnormality about the cavernous sinus. Overall, appearance is similar to prior. IMPRESSION: 1. 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 Small volume nonocclusive thrombus within the right sigmoid sinus. Overall, appearance is similar to prior. To involve the left transverse and sigmoid sinuses as well as the left jugular bulb and proximal left  internal jugular vein. 2. Otherwise normal brain MRI. No other acute intracranial abnormality. Electronically Signed  By: Rise Mu M.D.   On: 04/27/2023 02:21   MR MRV HEAD W WO CONTRAST  Result Date: 04/27/2023 CLINICAL DATA:  Initial evaluation for dural venous sinus thrombosis, neuro deficit EXAM: MRI HEAD WITHOUT CONTRAST MRV HEAD WITHOUT AND WITH CONTRAST TECHNIQUE: Multiplanar, multiecho pulse sequences of the brain and surrounding structures were obtained without and with intravenous contrast. Angiographic images of the intracranial venous structures were obtained using MRV technique without intravenous contrast. CONTRAST:  10mL GADAVIST GADOBUTROL 1 MMOL/ML IV SOLN COMPARISON:  Prior study from earlier the same day. FINDINGS: MRI BRAIN FINDINGS Brain: Cerebral volume within normal limits for age. No focal parenchymal signal abnormality. No abnormal foci of restricted diffusion to suggest acute or subacute ischemia. Gray-white matter differentiation well maintained. No encephalomalacia to suggest chronic cortical infarction or other insult. No foci of susceptibility artifact indicative of acute or chronic intracranial blood products. Diffuse prominence of the intracranial vasculature on SWI sequence, consistent with venous congestion related to dural venous sinus thrombosis. No mass lesion, midline shift or mass effect. Ventricles normal in size and morphology without hydrocephalus. No extra-axial fluid collection. Pituitary gland and suprasellar region within normal limits. Vascular: Major intracranial arterial vascular flow voids are maintained. Findings consistent with persistent dural venous sinus thrombosis, described on corresponding MRV. Skull and upper cervical spine: Craniocervical junction within normal limits. Diffuse loss of normal bone marrow signal, nonspecific but can be seen with anemia, smoking, obesity, and infiltrative/myelofibrotic marrow processes. No focal marrow  replacing lesion. No scalp soft tissue abnormality. Sinuses/Orbits: Globes orbital soft tissues demonstrate no acute finding. Paranasal sinuses are largely clear. Small left mastoid effusion noted. Other: None. MRV HEAD FINDINGS Persistent fairly extensive filling defects seen throughout the majority of the superior sagittal sinus to the torcula. Filling defect extends to involve the left transverse and sigmoid sinuses as well as the left jugular bulb and proximal left internal jugular vein. Right transverse sinus patent. Small volume nonocclusive thrombus within the right sigmoid sinus (series 3, image 51). Right jugular bulb and proximal right internal jugular vein are patent. Straight sinus, vein of Galen, and internal cerebral veins are patent. No appreciable cortical vein thrombosis. No appreciable abnormality about the cavernous sinus. Overall, appearance is similar to prior. IMPRESSION: 1. 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 Small volume nonocclusive thrombus within the right sigmoid sinus. Overall, appearance is similar to prior. To involve the left transverse and sigmoid sinuses as well as the left jugular bulb and proximal left internal jugular vein. 2. Otherwise normal brain MRI. No other acute intracranial abnormality. Electronically Signed   By: Rise Mu M.D.   On: 04/27/2023 02:21   CT HEAD CODE STROKE WO CONTRAST  Result Date: 04/26/2023 CLINICAL DATA:  Code stroke. Initial evaluation for neuro deficit, stroke. EXAM: CT HEAD WITHOUT CONTRAST TECHNIQUE: Contiguous axial images were obtained from the base of the skull through the vertex without intravenous contrast. RADIATION DOSE REDUCTION: This exam was performed according to the departmental dose-optimization program which includes automated exposure control,  adjustment of the mA and/or kV according to patient size and/or use of iterative reconstruction technique. COMPARISON:  Prior study from 04/24/2023. FINDINGS: Brain: Cerebral volume within normal limits. No acute intracranial hemorrhage. No acute large vessel territory infarct. No complication related to recently identified dural venous sinus thrombosis identified. No mass lesion or midline shift. No hydrocephalus  or extra-axial fluid collection. Vascular: No abnormal hyperdense vessel. Previously identified dural venous sinus thrombosis better evaluated on recent CT venogram. Skull: Scalp soft tissues and calvarium demonstrate no acute finding. Sinuses/Orbits: Globes orbital soft tissues within normal limits. Small right sphenoid sinus retention cyst. Paranasal sinuses are otherwise clear. No mastoid effusion. Other: None. ASPECTS [facility] Stroke Program Early CT Score) - Ganglionic level infarction (caudate, lentiform nuclei, internal capsule, insula, M1-M3 cortex): 7 - Supraganglionic infarction (M4-M6 cortex): 3 Total score (0-10 with 10 being normal): 10 IMPRESSION: 1. No acute intracranial abnormality. No intracranial hemorrhage or other complication related to recently identified dural venous sinus thrombosis. 2. ASPECTS is 10. These results were communicated to Dr. Amada Jupiter at 11:29 pm on 04/26/2023 by text page via the [facility] messaging system. Electronically Signed   By: Rise Mu M.D.   On: 04/26/2023 23:31   CT VENOGRAM HEAD  Result Date: 04/24/2023 CLINICAL DATA:  right sided numbness. EXAM: CT HEAD WITHOUT CONTRAST CT VENOGRAM HEAD TECHNIQUE: Precontrast axial CT of the head with multiplanar reformats. Venographic phase images of the brain were obtained following the administration of intravenous contrast. Multiplanar reformats and maximum intensity projections were generated. RADIATION DOSE REDUCTION: This exam was performed according to the departmental dose-optimization program which  includes automated exposure control, adjustment of the mA and/or kV according to patient size and/or use of iterative reconstruction technique. CONTRAST:  75mL OMNIPAQUE IOHEXOL 350 MG/ML SOLN COMPARISON:  None Available. FINDINGS: No acute intracranial hemorrhage. Gray-white differentiation is preserved. No hydrocephalus or extra-axial collection. No mass effect or midline shift. Partially empty sella. No hyperdense vessel. No calvarial fracture or suspicious bone lesion. Skull base is unremarkable. Mastoids, paranasal sinuses and orbits are unremarkable. Extensive thrombosis of the superior sagittal sinus extending into the transverse and sigmoid sinuses, as well as the upper left internal jugular vein. Left dominant transverse sinus. Possible small amount of thrombus in the right sigmoid sinus (image 115 series 8). IMPRESSION: Extensive thrombosis of the superior sagittal sinus extending into the transverse and sigmoid sinuses, as well as the upper left internal jugular vein. Possible small amount of thrombus in the right sigmoid sinus. Electronically Signed   By: Orvan Falconer M.D.   On: 04/24/2023 16:58    Micro Results     No results found for this or any previous visit (from the past 240 hour(s)).  Today   Subjective    Hena Arrellano today has no headache,no chest abdominal pain,no new weakness tingling or numbness, feels much better wants to go home today.     Objective   Blood pressure 124/74, pulse 70, temperature 97.6 F (36.4 C), temperature source Oral, resp. rate 20, height 5\' 6"  (1.676 m), weight 111.1 kg, last menstrual period 04/22/2023, SpO2 100 %.  No intake or output data in the 24 hours ending 04/28/23 1024  Exam  Awake Alert, No new F.N deficits,    [place].AT,PERRAL Supple Neck,   Symmetrical Chest wall movement, Good air movement bilaterally, CTAB RRR,No Gallops,   +ve B.Sounds, Abd Soft, Non tender,  No Cyanosis, Clubbing or edema    Data Review   Recent Labs   Lab 04/24/23 1434 04/26/23 2158 04/27/23 0532 04/28/23 0432  WBC  --  10.2 15.4* 11.9*  HGB 11.9* 12.4 11.7* 11.3*  HCT 35.0* 40.3 38.0 36.6  PLT  --  403* 439* 413*  MCV  --  83.3 85.2 84.5  MCH  --  25.6* 26.2 26.1  MCHC  --  30.8 30.8 30.9  RDW  --  14.4 14.5 14.7  LYMPHSABS  --  2.5  --   --   MONOABS  --  0.5  --   --   EOSABS  --  0.0  --   --   BASOSABS  --  0.1  --   --     Recent Labs  Lab 04/24/23 1434 04/26/23 2158 04/27/23 0532 04/28/23 0432  NA 141 138 136 136  K 3.5 3.5 3.4* 3.9  CL 107 106 105 108  CO2  --  19* 20* 18*  ANIONGAP  --  13 11 10   GLUCOSE 100* 108* 115* 107*  BUN 9 12 10 15   CREATININE 0.80 0.87 0.76 0.92  AST  --  13*  --   --   ALT  --  14  --   --   ALKPHOS  --  64  --   --   BILITOT  --  <0.1*  --   --   ALBUMIN  --  2.7*  --   --   INR  --  1.1  --   --   HGBA1C  --   --  5.6  --   MG  --   --  1.9  --   CALCIUM  --  8.8* 8.4* 8.4*    Total Time in preparing paper work, data evaluation and todays exam - 35 minutes  Signature  -    Susa Raring M.D on 04/28/2023 at 10:24 AM   -  To page go to [email or web address]

## 2023-04-28 NOTE — Evaluation (Signed)
Physical Therapy Evaluation Patient Details Name: Molly Wise MRN: [id number] DOB: May 12, 2001 Today's Date: 04/28/2023  History of Present Illness  Pt is a 22 y/o F admitted on 04/26/23 after presenting with L sided numbness & tingling, & HA. 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 PMH: IgA glomerulonephritis with nephrotic range proteinuria, HTN, VTE, recent diagnosis of dural venous sinus thrombosis on eliquis  Clinical Impression  Pt seen for PT evaluation with pt agreeable to tx, mother present in room. Prior to admission pt was living alone in apartment in [place], working, driving, independent without AD. Pt reports she plans to d/c to her mother's house.  On this date, pt is independent in bed mobility & transfers, gait without AD with mod I, & stair negotiation with 1-2 rails with CGA. Pt demonstrates decreased stance time LLE during gait, as well as decreased LUE reciprocal arm swing. At end of session, pt reports dizziness after ascending stairs. At this time, pt is cleared to d/c home with mother's PRN assistance, will recommend OPPT f/u (pt notes she's in process of finding PCP).      Recommendations for follow up therapy are one component of a multi-disciplinary discharge planning process, led by the attending physician.  Recommendations may be updated based on patient status, additional functional criteria and insurance authorization.  Follow Up Recommendations       Assistance Recommended at Discharge PRN  Patient can return home with the following  Assistance with cooking/housework;Help with stairs or ramp for entrance    Equipment Recommendations None recommended by PT  Recommendations for Other Services       Functional Status Assessment Patient has had a recent decline in their functional status and demonstrates the ability to make significant improvements in function in a reasonable and predictable amount of time.     Precautions / Restrictions  Precautions Precautions: None Restrictions Weight Bearing Restrictions: No      Mobility  Bed Mobility Overal bed mobility: Independent                  Transfers Overall transfer level: Independent Equipment used: None                    Ambulation/Gait Ambulation/Gait assistance: Modified independent (Device/Increase time) Gait Distance (Feet): 200 Feet Assistive device: None Gait Pattern/deviations: Decreased stance time - left Gait velocity: slightly decreased     General Gait Details: decreased LUE reciprocal arm swing  Stairs Stairs: Yes Stairs assistance: Min guard Stair Management: One rail Right, One rail Left, Two rails, Alternating pattern Number of Stairs: 10 (6")    Wheelchair Mobility    Modified Rankin (Stroke Patients Only)       Balance Overall balance assessment: Mild deficits observed, not formally tested   Sitting balance-Leahy Scale: Normal     Standing balance support: During functional activity, No upper extremity supported Standing balance-Leahy Scale: Good                               Pertinent Vitals/Pain Pain Assessment Pain Assessment: No/denies pain    Home Living Family/patient expects to be discharged to:: Private residence Living Arrangements: Alone Available Help at Discharge: Family Type of Home: House Home Access: Stairs to enter Entrance Stairs-Rails: Right;Left (wideset) Entrance Stairs-Number of Steps: 3-4   Home Layout: One level Home Equipment: None Additional Comments: Pt lives alone in apartment in [place] but is planning to  return to her mother's house at d/c. Information provided for mother's house.    Prior Function Prior Level of Function : Independent/Modified Independent;Driving;Working/employed             Mobility Comments: works at [employer]   Dominant Hand: Right    Extremity/Trunk Assessment   Upper Extremity  Assessment Upper Extremity Assessment: Overall WFL for tasks assessed (opposition WNL)    Lower Extremity Assessment Lower Extremity Assessment: LLE deficits/detail (BLE proprioception intact) LLE Deficits / Details: LLE generalized weakness (5/5 hip flexion in sitting, 4+/5 knee extension in sitting), heel to shin slightly slower LLE compared to RLE    Cervical / Trunk Assessment Cervical / Trunk Assessment: Normal  Communication   Communication: No difficulties  Cognition Arousal/Alertness: Awake/alert Behavior During Therapy: WFL for tasks assessed/performed Overall Cognitive Status: Within Functional Limits for tasks assessed                                          General Comments      Exercises     Assessment/Plan    PT Assessment Patient needs continued PT services  PT Problem List Decreased balance;Decreased mobility       PT Treatment Interventions Therapeutic exercise;Gait training;Balance training;Stair training;Neuromuscular re-education;Functional mobility training;Therapeutic activities;Patient/family education    PT Goals (Current goals can be found in the Care Plan section)  Acute Rehab PT Goals Patient Stated Goal: return to independent PLOF PT Goal Formulation: With patient Time For Goal Achievement: 05/12/23 Potential to Achieve Goals: Good Additional Goals Additional Goal #1: Pt will score 53/56 on Berg Balance test to demonstrate decreased fall risk with mobility.    Frequency Min 2X/week     Co-evaluation               AM-PAC PT "6 Clicks" Mobility  Outcome Measure Help needed turning from your back to your side while in a flat bed without using bedrails?: None Help needed moving from lying on your back to sitting on the side of a flat bed without using bedrails?: None Help needed moving to and from a bed to a chair (including a wheelchair)?: None Help needed standing up from a chair using your arms (e.g., wheelchair  or bedside chair)?: None Help needed to walk in hospital room?: None Help needed climbing 3-5 steps with a railing? : A Little 6 Click Score: 23    End of Session   Activity Tolerance: Patient tolerated treatment well Patient left:  (toileting in bathroom without assistance)   PT Visit Diagnosis: Hemiplegia and hemiparesis;Other abnormalities of gait and mobility (R26.89) Hemiplegia - Right/Left: Left Hemiplegia - dominant/non-dominant: Non-dominant Hemiplegia - caused by: Other cerebrovascular disease    Time: 1610-9604 PT Time Calculation (min) (ACUTE ONLY): 17 min   Charges:   PT Evaluation $PT Eval Moderate Complexity: 1 Mod          Aleda Grana, PT, DPT 04/28/23, 9:14 AM   Sandi Mariscal 04/28/2023, 9:13 AM

## 2023-04-28 NOTE — Progress Notes (Signed)
OT Cancellation Note  Patient Details Name: Molly Wise MRN: [id number] DOB: 10-22-2001   Cancelled Treatment:    Reason Eval/Treat Not Completed: OT screened, no needs identified, will sign off Ambulating well with PT, no assist needed for ADLs. No formal OT eval needed  Lorre Munroe 04/28/2023, 10:07 AM

## 2023-04-28 NOTE — Discharge Instructions (Addendum)
Information on my medicine - ELIQUIS (apixaban)  This medication education was reviewed with me or my healthcare representative as part of my discharge preparation.  The pharmacist that spoke with me during my hospital stay was:    Why was Eliquis prescribed for you? Eliquis was prescribed to treat blood clots that may have been found in the veins of your legs (deep vein thrombosis) or in your lungs (pulmonary embolism) and to reduce the risk of them occurring again.  What do You need to know about Eliquis ? The starting dose is 10 mg (two 5 mg tablets) taken TWICE daily for the FIRST SEVEN (7) DAYS, then on (enter date)  05/03/23  the dose is reduced to ONE 5 mg tablet taken TWICE daily.  Eliquis may be taken with or without food.   Try to take the dose about the same time in the morning and in the evening. If you have difficulty swallowing the tablet whole please discuss with your pharmacist how to take the medication safely.  Take Eliquis exactly as prescribed and DO NOT stop taking Eliquis without talking to the doctor who prescribed the medication.  Stopping may increase your risk of developing a new blood clot.  Refill your prescription before you run out.  After discharge, you should have regular check-up appointments with your healthcare provider that is prescribing your Eliquis.    What do you do if you miss a dose? If a dose of ELIQUIS is not taken at the scheduled time, take it as soon as possible on the same day and twice-daily administration should be resumed. The dose should not be doubled to make up for a missed dose.  Important Safety Information A possible side effect of Eliquis is bleeding. You should call your healthcare provider right away if you experience any of the following: Bleeding from an injury or your nose that does not stop. Unusual colored urine (red or dark brown) or unusual colored stools (red or black). Unusual bruising for unknown reasons. A  serious fall or if you hit your head (even if there is no bleeding).  Some medicines may interact with Eliquis and might increase your risk of bleeding or clotting while on Eliquis. To help avoid this, consult your healthcare provider or pharmacist prior to using any new prescription or non-prescription medications, including herbals, vitamins, non-steroidal anti-inflammatory drugs (NSAIDs) and supplements.  This website has more information on Eliquis (apixaban): http://[email or web address].com/[email or web address]    Follow with Primary MD Deatra James, MD in 7 days to also follow-up with your neurologist within a week of discharge.  Get CBC, CMP  -  checked next visit with your primary MD   Activity: As tolerated with Full fall precautions use walker/cane & assistance as needed  Disposition Home    Diet: Heart Healthy    Special Instructions: If you have smoked or chewed Tobacco  in the last 2 yrs please stop smoking, stop any regular Alcohol  and or any Recreational drug use.  On your next visit with your primary care physician please Get Medicines reviewed and adjusted.  Please request your Prim.MD to go over all Hospital Tests and Procedure/Radiological results at the follow up, please get all Hospital records sent to your Prim MD by signing hospital release before you go home.  If you experience worsening of your admission symptoms, develop shortness of breath, life threatening emergency, suicidal or homicidal thoughts you must seek medical attention immediately by calling 911 or calling your MD immediately  if  symptoms less severe.  You Must read complete instructions/literature along with all the possible adverse reactions/side effects for all the Medicines you take and that have been prescribed to you. Take any new Medicines after you have completely understood and accpet all the possible adverse reactions/side effects.   Do not drive when taking Pain medications.  Do not take more than  prescribed Pain, Sleep and Anxiety Medications

## 2023-04-28 NOTE — Progress Notes (Signed)
   04/28/23 0900  Spiritual Encounters  Type of Visit Initial  Care provided to: Patient;Family  Referral source Family  Reason for visit Advance directives  OnCall Visit No   Ch responded to request for AD. There was no family at bedside. Ch assisted pt and family with AD. Ch left a copy of AD with family. They will page Ch when they are ready. No follow-up needed at this time.

## 2023-04-28 NOTE — TOC CM/SW Note (Signed)
Transition of Care [facility]) - Inpatient Brief Assessment   Patient Details  Name: Molly Wise MRN: [id number] Date of Birth: Apr 01, 2001  Transition of Care [facility]) CM/SW Contact:    Gordy Clement, RN Phone Number: 04/28/2023, 8:44 AM   Clinical Narrative:  Patient here from home- observation status. No TOC needs identified.  Transition of Care [facility]) Screening Note   Patient Details  Name: Molly Wise Date of Birth: 04-25-2001   Transition of Care [facility]) CM/SW Contact:    Gordy Clement, RN Phone Number: 04/28/2023, 8:45 AM   Patient from Home . Transition of Care Department [facility]) has reviewed patient and no TOC needs have been identified at this time. We will continue to monitor patient advancement through interdisciplinary progression rounds. If new patient transition needs arise, please place a TOC consult.     Transition of Care Asessment: Insurance and Status: Insurance coverage has been reviewed Patient has primary care physician: Yes Wynelle Link, Charise Carwin, MD) Home environment has been reviewed: From Home Prior level of function:: Independent Prior/Current Home Services: No current home services Social Determinants of Health Reivew: SDOH reviewed no interventions necessary Readmission risk has been reviewed: Yes (None listed  Observation) Transition of care needs: no transition of care needs at this time

## 2023-05-01 ENCOUNTER — Encounter: Payer: Self-pay | Admitting: Neurology

## 2023-05-01 ENCOUNTER — Ambulatory Visit (INDEPENDENT_AMBULATORY_CARE_PROVIDER_SITE_OTHER): Payer: No Typology Code available for payment source | Admitting: Neurology

## 2023-05-01 ENCOUNTER — Encounter: Payer: Self-pay | Admitting: Hematology and Oncology

## 2023-05-01 VITALS — BP 124/78 | HR 70 | Ht 60.0 in | Wt 236.4 lb

## 2023-05-01 DIAGNOSIS — R760 Raised antibody titer: Secondary | ICD-10-CM | POA: Diagnosis not present

## 2023-05-01 DIAGNOSIS — G08 Intracranial and intraspinal phlebitis and thrombophlebitis: Secondary | ICD-10-CM | POA: Diagnosis not present

## 2023-05-01 DIAGNOSIS — Z862 Personal history of diseases of the blood and blood-forming organs and certain disorders involving the immune mechanism: Secondary | ICD-10-CM

## 2023-05-01 DIAGNOSIS — G441 Vascular headache, not elsewhere classified: Secondary | ICD-10-CM | POA: Diagnosis not present

## 2023-05-01 MED ORDER — TOPIRAMATE 50 MG PO TABS: 50.0000 mg | ORAL_TABLET | Freq: Two times a day (BID) | ORAL | 3 refills | Status: DC

## 2023-05-01 NOTE — Progress Notes (Signed)
[facility] [address] [place]. [place] [address] [phone]       OFFICE CONSULT NOTE  Molly Wise Date of Birth:  10-Apr-2001 Medical Record Number:  [id number]   Referring MD:  Dr Wynelle Link  Reason for Referral: Cerebral venous sinus thrombosis  HPI: Molly Wise is a 22 year old African-American lady seen today for initial office consultation visit.  She is open from the patient and review of electronic medical records in care everywhere.  Actual imaging films are not available but results were reviewed in PACS.  She has past medical history of nephrotic syndrome, lupus-like syndrome, pulmonary embolism and DVT.  She presented to the [facility] in [place] on 04/20/2023 with 1-1/2-week history of headaches, light sensitivity.  MRI scan showed abnormal signal intensity in the left sigmoid and transverse sinus suggestive of thrombosis.  CT angiogram showed large volume thrombus within the superior sagittal sinus, bilateral transverse and sigmoid sinuses.  She was started on IV heparin.  He was seen by hematology transferred to Eliquis at discharge.  Hypercoagulable panel lab work was done and she was found to have positive lupus anticoagulant.  Anticardiolipin antibodies and beta-2 glycoprotein were negative.  Urine drug screen was negative.  Patient presented to [facility] on 04/26/2023 with transient recurrence of numbness on the right side with tingling sensation and right facial droop and hand weakness.  Neurological exam is nonfocal except for blurred disc margins bilaterally.  MRI scan of the brain shows persistent dural venous sinus thrombosis involving majority of the superior sagittal sinus, left transverse and sigmoid sinuses extension into the left uvulopalatal and proximal left internal jugular vein.  Small volume nonocclusive thrombus was also noted in the right sigmoid sinus.  The echo showed normal ejection fraction of 65 to 70%.   Hemoglobin A1c was 5.6.  LDL cholesterol was 171 mg patient was not considered Eliquis failure as she had taken it only for a few days prior.  She was continued on it.  Will also started on Diamox She has not noticed any improvement in her headache and does not like the paresthesias she is getting better.  She does have prior history of DVT and pulm embolism in June 2022 following which she was placed on Eliquis for 6 months.  It was felt to be related to nephrotic syndrome and autoimmune disorder related to that he had a positive ANA and dsDNA.  He is currently on prednisone and CellCept.  Today she states that she is has daily headaches which vary in severity from 4/10 to 7/10.  She does take Tylenol which has not helped.  She is also taking Fioricet but is not sure it is helping.  Headache is somewhat improved, She does have some mild light and sound sensitivity and nausea and vomiting.  ROS:   14 system review of systems is positive for headache, light sensitivity, sound sensitivity all other systems negative  PMH:  Past Medical History:  Diagnosis Date   Chronic kidney disease    DVT (deep venous thrombosis) (HCC)     Social History:  Social History   Socioeconomic History   Marital status: Single    Spouse name: Not on file   Number of children: Not on file   Years of education: Not on file   Highest education level: Not on file  Occupational History   Occupation: Theme park manager  Tobacco Use   Smoking status: Never   Smokeless tobacco: Never  Vaping Use  Vaping Use: Never used  Substance and Sexual Activity   Alcohol use: Never   Drug use: Never   Sexual activity: Not Currently  Other Topics Concern   Not on file  Social History Narrative   In college. Lives in [place] at school, and with Mother when out of school.   Social Determinants of Health   Financial Resource Strain: Not on file  Food Insecurity: Not on file  Transportation Needs: No Transportation  Needs (04/27/2023)   PRAPARE - Administrator, Civil Service (Medical): No    Lack of Transportation (Non-Medical): No  Physical Activity: Not on file  Stress: Not on file  Social Connections: Not on file  Intimate Partner Violence: Not At Risk (04/27/2023)   Humiliation, Afraid, Rape, and Kick questionnaire    Fear of Current or Ex-Partner: No    Emotionally Abused: No    Physically Abused: No    Sexually Abused: No    Medications:   Current Outpatient Medications on File Prior to Visit  Medication Sig Dispense Refill   acetaminophen (TYLENOL) 500 MG tablet Take 1,000 mg by mouth every 6 (six) hours as needed for moderate pain.     acetaZOLAMIDE (DIAMOX) 250 MG tablet Take 1 tablet (250 mg total) by mouth 2 (two) times daily. 14 tablet 0   apixaban (ELIQUIS) 5 MG TABS tablet Take 1-2 tablets (5-10 mg total) by mouth See admin instructions. Take 10 mg by mouth twice daily for a total of 7 days. Then switch to taking 5 mg twice daily starting day 8. 60 tablet 0   butalbital-acetaminophen-caffeine (FIORICET) 50-325-40 MG tablet Take 1 tablet by mouth every 6 (six) hours as needed for up to 10 days for headache. 14 tablet 0   empagliflozin (JARDIANCE) 10 MG TABS tablet Take 10 mg by mouth daily.     FEROSUL 325 (65 Fe) MG tablet Take 325 mg by mouth daily with breakfast.     losartan (COZAAR) 50 MG tablet Take 50 mg by mouth in the morning and at bedtime.     mycophenolate (CELLCEPT) 250 MG capsule Take 1,500 mg by mouth 2 (two) times daily.     predniSONE (DELTASONE) 5 MG tablet Take 10 mg by mouth daily with breakfast.     rosuvastatin (CRESTOR) 10 MG tablet Take 10 mg by mouth daily.     Vitamin D, Ergocalciferol, (DRISDOL) 1.25 MG (50000 UNIT) CAPS capsule Take 50,000 Units by mouth every 7 (seven) days.     Voclosporin (LUPKYNIS) 7.9 MG CAPS Take 7.9 mg by mouth in the morning and at bedtime.     No current facility-administered medications on file prior to visit.     Allergies:  No Known Allergies  Physical Exam General: Obese pleasant young African-American lady, seated, in no evident distress Head: head normocephalic and atraumatic.   Neck: supple with no carotid or supraclavicular bruits Cardiovascular: regular rate and rhythm, no murmurs Musculoskeletal: no deformity Skin:  no rash/petichiae Vascular:  Normal pulses all extremities  Neurologic Exam Mental Status: Awake and fully alert. Oriented to place and time. Recent and remote memory intact. Attention span, concentration and fund of knowledge appropriate. Mood and affect appropriate.  Cranial Nerves: Fundoscopic exam reveals sharp disc margins. Pupils equal, briskly reactive to light. Extraocular movements full without nystagmus. Visual fields full to confrontation. Hearing intact. Facial sensation intact. Face, tongue, palate moves normally and symmetrically.  Motor: Normal bulk and tone. Normal strength in all tested extremity muscles. Sensory.: intact  to touch , pinprick , position and vibratory sensation.  Coordination: Rapid alternating movements normal in all extremities. Finger-to-nose and heel-to-shin performed accurately bilaterally. Gait and Station: Arises from chair without difficulty. Stance is normal. Gait demonstrates normal stride length and balance . Able to heel, toe and tandem walk without difficulty.  Reflexes: 1+ and symmetric. Toes downgoing.   NIHSS  0 Modified Rankin  1   ASSESSMENT: 22 year old African-American lady with cerebral venous sinus thrombosis likely due to hypercoagulable state from nephrotic syndrome and autoimmune disorder with positive lupus anticoagulant..  Prior history of DVT.  Patient doing well except for persistent headache and paresthesias     PLAN:I had a long discussion with the patient and her mother regarding her cerebral venous sinus thrombosis, headache and numbness related to hypercoagulability from lupus anticoagulant from her  autoimmune disease.  Recommend continue anticoagulation with Eliquis for life and maintain aggressive hydration and drink at least 8/work from home for the first 2 weeks.  Glasses of fluids a day.  Discontinue Diamox as it is not helping and may be causing side effects.  Try Topamax 50 mg at night for 1 week to be increased to twice daily as tolerated.  I also encouraged the patient to eat a healthy diet and to be active and exercise regularly and lose weight.  He was encouraged to keep her appointment with her ophthalmologist today  to document blind spot and visual fields.  Plan to repeat MR or CT venogram upon follow-up visit in 3 months.  She may return to work from next week but can work from home for the first 2 weeks.  Return for follow-up with me in 3 months or call earlier if necessary.  Greater than 50% time during this 60-minute consultation visit were spent in counseling and coordination of care about her hypercoagulable state, positive lupus anticoagulant cerebral venous sinus thrombosis and  Delia Heady, MD Note: This document was prepared with digital dictation and possible smart phrase technology. Any transcriptional errors that result from this process are unintentional.

## 2023-05-01 NOTE — Patient Instructions (Addendum)
I had a long discussion with the patient and her mother regarding her cerebral venous sinus thrombosis, headache and numbness related to hypercoagulability from lupus anticoagulant from her autoimmune disease.  Recommend continue anticoagulation with Eliquis for life and maintain aggressive hydration and drink at least 8/work from home for the first 2 weeks.  Glasses of fluids a day.  Discontinue Diamox as it is not helping and may be causing side effects.  Try Topamax 50 mg at night for 1 week to be increased to twice daily as tolerated.  I also encouraged the patient to eat a healthy diet and to be active and exercise regularly and lose weight.  He was encouraged to keep her appointment with her ophthalmologist today  to document blind spot and visual fields.  She may return to work from next week but can work from home for the first 2 weeks.  Return for follow-up with me in 3 months or call earlier if necessary.

## 2023-05-07 ENCOUNTER — Inpatient Hospital Stay
Payer: No Typology Code available for payment source | Attending: Hematology and Oncology | Admitting: Hematology and Oncology

## 2023-05-07 ENCOUNTER — Other Ambulatory Visit: Payer: Self-pay

## 2023-05-07 VITALS — BP 130/80 | HR 81 | Temp 97.9°F | Resp 14 | Ht 60.0 in | Wt 238.6 lb

## 2023-05-07 DIAGNOSIS — I2699 Other pulmonary embolism without acute cor pulmonale: Secondary | ICD-10-CM | POA: Diagnosis not present

## 2023-05-07 DIAGNOSIS — Z7901 Long term (current) use of anticoagulants: Secondary | ICD-10-CM | POA: Diagnosis not present

## 2023-05-07 DIAGNOSIS — Z801 Family history of malignant neoplasm of trachea, bronchus and lung: Secondary | ICD-10-CM | POA: Insufficient documentation

## 2023-05-07 DIAGNOSIS — N02B9 Other recurrent and persistent immunoglobulin A nephropathy: Secondary | ICD-10-CM

## 2023-05-07 DIAGNOSIS — I82431 Acute embolism and thrombosis of right popliteal vein: Secondary | ICD-10-CM | POA: Diagnosis present

## 2023-05-07 DIAGNOSIS — N02B1 Recurrent and persistent immunoglobulin A nephropathy with glomerular lesion: Secondary | ICD-10-CM | POA: Insufficient documentation

## 2023-05-07 DIAGNOSIS — R809 Proteinuria, unspecified: Secondary | ICD-10-CM | POA: Diagnosis not present

## 2023-05-07 NOTE — Progress Notes (Signed)
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  Initial hypercoagulable work-up in her chart, she tested negative for factor V Leiden mutation, prothrombin gene mutation, lupus anticoagulant negative, negative beta-2 glycoprotein antibodies, mildly elevated anticardiolipin IgM antibodies and PNH flow was negative.    Labs from 7/18, with mildly elevated anticardiolipin antibodies, normal protein C activity, normal protein S activity, mildly elevated AT III. We have discussed that nephrotic range proteinuria and IgA glomerulonephritis may have created a hypercoagulable state due to an imbalance between naturally occurring procoagulants and anticoagulants. 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  We did not find any evidence of monoclonal gammopathy.  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  She is here for follow-up since this evening.  She continues to have some intermittent headaches and right upper extremity numbness related to this but overall getting better.  She is also continuing to follow-up with Dr. Hazle Quant for the IgA glomerulonephritis and has been started on voclosporin for better control of lupus.  At this time we have discussed about indefinite anticoagulation since this is the second episode of DVT.  No concerns on physical exam.  She understands the risk of bleeding with anticoagulation on board.  She can return to hematology once a year.  She will continue to be followed  for the nephrotic range proteinuria by her nephrology team.  She was encouraged to call us for any anticoagulation refills.   HISTORY OF PRESENTING ILLNESS:   Molly Wise 22 y.o. female is here because of popliteal vein DVT  This is a very pleasant 22 year old female patient with no significant past medical history, recently diagnosed with IgA glomerulonephritis and currently on prednisone, also was found to have right lower extremity DVT and pulmonary embolism referred to hematology for anticoagulation recommendations. During her initial visit, hypercoagulable work up was neg, mildly elevated aCL antibodies and antithrombin activity.  She was intermittently on anticoagulation but recently happened to have another episode of DVT, diagnosed with cerebral venous sinus thrombus and restart her anticoagulation.  She is here for follow-up with mom.  Her headaches have been getting better however her right upper extremity numbness has not been much different since the hospitalization.  She has been taking Eliquis as prescribed.  She is also now on voclosporin along with CellCept and prednisone for lupus control.  She is hoping to come off of prednisone with the voclosporin.  Rest of the pertinent 10 point ROS reviewed and negative    MEDICAL HISTORY:  Past Medical History:  Diagnosis Date   Chronic kidney disease    DVT (deep venous thrombosis) (HCC)     SURGICAL HISTORY: Past Surgical History:  Procedure Laterality Date   RENAL BIOPSY Left 2022    SOCIAL HISTORY: Social History   Socioeconomic History   Marital status: Single    Spouse name: Not on file   Number of children: Not on file   Years of education: Not on file   Highest education level: Not on  file  Occupational History   Occupation: Theme park manager  Tobacco Use   Smoking status: Never   Smokeless tobacco: Never  Vaping Use   Vaping Use: Never used  Substance and Sexual Activity   Alcohol use: Never    Drug use: Never   Sexual activity: Not Currently  Other Topics Concern   Not on file  Social History Narrative   In college. Lives in [place] at school, and with Mother when out of school.   Social Determinants of Health   Financial Resource Strain: Not on file  Food Insecurity: Not on file  Transportation Needs: No Transportation Needs (04/27/2023)   PRAPARE - Administrator, Civil Service (Medical): No    Lack of Transportation (Non-Medical): No  Physical Activity: Not on file  Stress: Not on file  Social Connections: Not on file  Intimate Partner Violence: Not At Risk (04/27/2023)   Humiliation, Afraid, Rape, and Kick questionnaire    Fear of Current or Ex-Partner: No    Emotionally Abused: No    Physically Abused: No    Sexually Abused: No    FAMILY HISTORY: Family History  Problem Relation Age of Onset   Graves' disease Mother    Diabetes Father    Cancer Maternal Grandmother    Cancer - Lung Paternal Grandmother        smoked   Cancer Paternal Grandmother    Diabetes Paternal Grandfather    Hyperlipidemia Paternal Grandfather    Kidney disease Paternal Grandfather     ALLERGIES:  has No Known Allergies.  MEDICATIONS:  Current Outpatient Medications  Medication Sig Dispense Refill   acetaminophen (TYLENOL) 500 MG tablet Take 1,000 mg by mouth every 6 (six) hours as needed for moderate pain.     apixaban (ELIQUIS) 5 MG TABS tablet Take 1-2 tablets (5-10 mg total) by mouth See admin instructions. Take 10 mg by mouth twice daily for a total of 7 days. Then switch to taking 5 mg twice daily starting day 8. 60 tablet 0   butalbital-acetaminophen-caffeine (FIORICET) 50-325-40 MG tablet Take 1 tablet by mouth every 6 (six) hours as needed for up to 10 days for headache. 14 tablet 0   empagliflozin (JARDIANCE) 10 MG TABS tablet Take 10 mg by mouth daily.     FEROSUL 325 (65 Fe) MG tablet Take 325 mg by mouth daily with breakfast.     losartan (COZAAR) 50 MG  tablet Take 50 mg by mouth in the morning and at bedtime.     mycophenolate (CELLCEPT) 250 MG capsule Take 1,500 mg by mouth 2 (two) times daily.     predniSONE (DELTASONE) 5 MG tablet Take 10 mg by mouth daily with breakfast.     rosuvastatin (CRESTOR) 10 MG tablet Take 10 mg by mouth daily.     topiramate (TOPAMAX) 50 MG tablet Take 1 tablet (50 mg total) by mouth 2 (two) times daily. 60 tablet 3   Vitamin D, Ergocalciferol, (DRISDOL) 1.25 MG (50000 UNIT) CAPS capsule Take 50,000 Units by mouth every 7 (seven) days.     Voclosporin (LUPKYNIS) 7.9 MG CAPS Take 7.9 mg by mouth in the morning and at bedtime.     No current facility-administered medications for this visit.    PHYSICAL EXAMINATION: ECOG PERFORMANCE STATUS: 0  Vitals:   05/07/23 1357  BP: 130/80  Pulse: 81  Resp: 14  Temp: 97.9 F (36.6 C)  SpO2: 98%     Filed Weights   05/07/23 1357  Weight: 238 lb 9.6 oz (108.2 kg)   Physical Exam Constitutional:      Appearance: Normal appearance.  Cardiovascular:     Rate and Rhythm: Normal rate and regular rhythm.  Pulmonary:     Effort: Pulmonary effort is normal.     Breath sounds: Normal breath sounds.  Abdominal:     General: Abdomen is flat.     Palpations: Abdomen is soft.  Musculoskeletal:        General: No swelling or tenderness.     Cervical back: Normal range of motion. No rigidity.  Lymphadenopathy:     Cervical: No cervical adenopathy.  Skin:    General: Skin is warm and dry.  Neurological:     Mental Status: She is alert.  Psychiatric:        Mood and Affect: Mood normal.      LABORATORY DATA:  I have reviewed the data as listed Lab Results  Component Value Date   WBC 11.9 (H) 04/28/2023   HGB 11.3 (L) 04/28/2023   HCT 36.6 04/28/2023   MCV 84.5 04/28/2023   PLT 413 (H) 04/28/2023     Chemistry      Component Value Date/Time   NA 136 04/28/2023 0432   NA 139 04/05/2022 0925   K 3.9 04/28/2023 0432   CL 108 04/28/2023 0432   CO2 18  (L) 04/28/2023 0432   BUN 15 04/28/2023 0432   BUN 15 04/05/2022 0925   CREATININE 0.92 04/28/2023 0432   CREATININE 0.75 11/22/2021 1537      Component Value Date/Time   CALCIUM 8.4 (L) 04/28/2023 0432   ALKPHOS 64 04/26/2023 2158   AST 13 (L) 04/26/2023 2158   AST 11 (L) 11/22/2021 1537   ALT 14 04/26/2023 2158   ALT 7 11/22/2021 1537   BILITOT <0.1 (L) 04/26/2023 2158   BILITOT 0.5 11/22/2021 1537       RADIOGRAPHIC STUDIES: I have personally reviewed the radiological images as listed and agreed with the findings in the report. ECHOCARDIOGRAM COMPLETE  Result Date: 04/27/2023    ECHOCARDIOGRAM REPORT   Patient Name:   KAHLIA GRUENBERG Date of Exam: 04/27/2023 Medical Rec #:  [id number]          Height:       66.0 in Accession #:    [id number]         Weight:       245.0 lb Date of Birth:  10/02/2001           BSA:          2.180 m Patient Age:    22 years           BP:           112/85 mmHg Patient Gender: F                  HR:           77 bpm. Exam Location:  Inpatient Procedure: 2D Echo, Pediatric Echo, Color Doppler and Intracardiac Opacification            Agent Indications:    CVA  History:        Patient has no prior history of Echocardiogram examinations.                 Stroke; Risk Factors:Hypertension and Family History of Coronary                 Artery Disease.  Sonographer:  Melissa Morford RDCS (AE, PE) Referring Phys: [id number] JINDONG XU IMPRESSIONS  1. Left ventricular ejection fraction, by estimation, is 65 to 70%. The left ventricle has normal function. The left ventricle has no regional wall motion abnormalities. Left ventricular diastolic parameters were normal.  2. Right ventricular systolic function is normal. The right ventricular size is normal. Tricuspid regurgitation signal is inadequate for assessing PA pressure.  3. The mitral valve is normal in structure. No evidence of mitral valve regurgitation. No evidence of mitral stenosis.  4. The aortic valve is  tricuspid. Aortic valve regurgitation is not visualized. No aortic stenosis is present. FINDINGS  Left Ventricle: Left ventricular ejection fraction, by estimation, is 65 to 70%. The left ventricle has normal function. The left ventricle has no regional wall motion abnormalities. Definity contrast agent was given IV to delineate the left ventricular  endocardial borders. The left ventricular internal cavity size was normal in size. There is no left ventricular hypertrophy. Left ventricular diastolic parameters were normal. Right Ventricle: The right ventricular size is normal. Right vetricular wall thickness was not well visualized. Right ventricular systolic function is normal. Tricuspid regurgitation signal is inadequate for assessing PA pressure. Left Atrium: Left atrial size was normal in size. Right Atrium: Right atrial size was normal in size. Pericardium: There is no evidence of pericardial effusion. Mitral Valve: The mitral valve is normal in structure. No evidence of mitral valve regurgitation. No evidence of mitral valve stenosis. Tricuspid Valve: The tricuspid valve is normal in structure. Tricuspid valve regurgitation is trivial. No evidence of tricuspid stenosis. Aortic Valve: The aortic valve is tricuspid. Aortic valve regurgitation is not visualized. No aortic stenosis is present. Aortic valve mean gradient measures 4.7 mmHg. Aortic valve peak gradient measures 10.6 mmHg. Aortic valve area, by VTI measures 2.45  cm. Pulmonic Valve: The pulmonic valve was not well visualized. Pulmonic valve regurgitation is not visualized. No evidence of pulmonic stenosis. Aorta: The aortic root and ascending aorta are structurally normal, with no evidence of dilitation. Venous: The inferior vena cava was not well visualized. IAS/Shunts: The interatrial septum was not well visualized.  LEFT VENTRICLE PLAX 2D LVIDd:         4.50 cm   Diastology LVIDs:         2.80 cm   LV e' medial:    7.62 cm/s LV PW:         1.10 cm    LV E/e' medial:  11.2 LV IVS:        0.90 cm   LV e' lateral:   10.60 cm/s LVOT diam:     2.10 cm   LV E/e' lateral: 8.1 LV SV:         83 LV SV Index:   38 LVOT Area:     3.46 cm  RIGHT VENTRICLE RV S prime:     12.30 cm/s TAPSE (M-mode): 2.5 cm LEFT ATRIUM             Index        RIGHT ATRIUM           Index LA diam:        4.10 cm 1.88 cm/m   RA Area:     14.00 cm LA Vol (A2C):   31.8 ml 14.59 ml/m  RA Volume:   30.80 ml  14.13 ml/m LA Vol (A4C):   22.8 ml 10.46 ml/m LA Biplane Vol: 29.3 ml 13.44 ml/m  AORTIC VALVE AV Area (Vmax):    2.89 cm  AV Area (Vmean):   3.38 cm AV Area (VTI):     2.45 cm AV Vmax:           162.88 cm/s AV Vmean:          97.530 cm/s AV VTI:            0.340 m AV Peak Grad:      10.6 mmHg AV Mean Grad:      4.7 mmHg LVOT Vmax:         136.00 cm/s LVOT Vmean:        95.200 cm/s LVOT VTI:          0.240 m LVOT/AV VTI ratio: 0.71  AORTA Ao Root diam: 2.60 cm Ao Asc diam:  2.20 cm MITRAL VALVE MV Area (PHT): 4.31 cm    SHUNTS MV E velocity: 85.70 cm/s  Systemic VTI:  0.24 m MV A velocity: 63.00 cm/s  Systemic Diam: 2.10 cm MV E/A ratio:  1.36 Dina Rich MD Electronically signed by Dina Rich MD Signature Date/Time: 04/27/2023/2:44:20 PM    Final    MR BRAIN WO CONTRAST  Result Date: 04/27/2023 CLINICAL DATA:  Initial evaluation for dural venous sinus thrombosis, neuro deficit EXAM: MRI HEAD WITHOUT CONTRAST MRV HEAD WITHOUT AND WITH CONTRAST TECHNIQUE: Multiplanar, multiecho pulse sequences of the brain and surrounding structures were obtained without and with intravenous contrast. Angiographic images of the intracranial venous structures were obtained using MRV technique without intravenous contrast. CONTRAST:  10mL GADAVIST GADOBUTROL 1 MMOL/ML IV SOLN COMPARISON:  Prior study from earlier the same day. FINDINGS: MRI BRAIN FINDINGS Brain: Cerebral volume within normal limits for age. No focal parenchymal signal abnormality. No abnormal foci of restricted diffusion to  suggest acute or subacute ischemia. Gray-white matter differentiation well maintained. No encephalomalacia to suggest chronic cortical infarction or other insult. No foci of susceptibility artifact indicative of acute or chronic intracranial blood products. Diffuse prominence of the intracranial vasculature on SWI sequence, consistent with venous congestion related to dural venous sinus thrombosis. No mass lesion, midline shift or mass effect. Ventricles normal in size and morphology without hydrocephalus. No extra-axial fluid collection. Pituitary gland and suprasellar region within normal limits. Vascular: Major intracranial arterial vascular flow voids are maintained. Findings consistent with persistent dural venous sinus thrombosis, described on corresponding MRV. Skull and upper cervical spine: Craniocervical junction within normal limits. Diffuse loss of normal bone marrow signal, nonspecific but can be seen with anemia, smoking, obesity, and infiltrative/myelofibrotic marrow processes. No focal marrow replacing lesion. No scalp soft tissue abnormality. Sinuses/Orbits: Globes orbital soft tissues demonstrate no acute finding. Paranasal sinuses are largely clear. Small left mastoid effusion noted. Other: None. MRV HEAD FINDINGS Persistent fairly extensive filling defects seen throughout the majority of the superior sagittal sinus to the torcula. Filling defect extends to involve the left transverse and sigmoid sinuses as well as the left jugular bulb and proximal left internal jugular vein. Right transverse sinus patent. Small volume nonocclusive thrombus within the right sigmoid sinus (series 3, image 51). Right jugular bulb and proximal right internal jugular vein are patent. Straight sinus, vein of Galen, and internal cerebral veins are patent. No appreciable cortical vein thrombosis. No appreciable abnormality about the cavernous sinus. Overall, appearance is similar to prior. IMPRESSION: 1. Persistent  dural venous sinus thrombosis involving the majority of the superior sagittal sinus, left transverse and sigmoid sinuses, with extension into the left jugular bulb and proximal left internal jugular vein. Small volume nonocclusive thrombus within the right sigmoid  sinus. Overall, appearance is similar to prior. To involve the left transverse and sigmoid sinuses as well as the left jugular bulb and proximal left internal jugular vein. 2. Otherwise normal brain MRI. No other acute intracranial abnormality. Electronically Signed   By: Rise Mu M.D.   On: 04/27/2023 02:21   MR MRV HEAD W WO CONTRAST  Result Date: 04/27/2023 CLINICAL DATA:  Initial evaluation for dural venous sinus thrombosis, neuro deficit EXAM: MRI HEAD WITHOUT CONTRAST MRV HEAD WITHOUT AND WITH CONTRAST TECHNIQUE: Multiplanar, multiecho pulse sequences of the brain and surrounding structures were obtained without and with intravenous contrast. Angiographic images of the intracranial venous structures were obtained using MRV technique without intravenous contrast. CONTRAST:  10mL GADAVIST GADOBUTROL 1 MMOL/ML IV SOLN COMPARISON:  Prior study from earlier the same day. FINDINGS: MRI BRAIN FINDINGS Brain: Cerebral volume within normal limits for age. No focal parenchymal signal abnormality. No abnormal foci of restricted diffusion to suggest acute or subacute ischemia. Gray-white matter differentiation well maintained. No encephalomalacia to suggest chronic cortical infarction or other insult. No foci of susceptibility artifact indicative of acute or chronic intracranial blood products. Diffuse prominence of the intracranial vasculature on SWI sequence, consistent with venous congestion related to dural venous sinus thrombosis. No mass lesion, midline shift or mass effect. Ventricles normal in size and morphology without hydrocephalus. No extra-axial fluid collection. Pituitary gland and suprasellar region within normal limits. Vascular:  Major intracranial arterial vascular flow voids are maintained. Findings consistent with persistent dural venous sinus thrombosis, described on corresponding MRV. Skull and upper cervical spine: Craniocervical junction within normal limits. Diffuse loss of normal bone marrow signal, nonspecific but can be seen with anemia, smoking, obesity, and infiltrative/myelofibrotic marrow processes. No focal marrow replacing lesion. No scalp soft tissue abnormality. Sinuses/Orbits: Globes orbital soft tissues demonstrate no acute finding. Paranasal sinuses are largely clear. Small left mastoid effusion noted. Other: None. MRV HEAD FINDINGS Persistent fairly extensive filling defects seen throughout the majority of the superior sagittal sinus to the torcula. Filling defect extends to involve the left transverse and sigmoid sinuses as well as the left jugular bulb and proximal left internal jugular vein. Right transverse sinus patent. Small volume nonocclusive thrombus within the right sigmoid sinus (series 3, image 51). Right jugular bulb and proximal right internal jugular vein are patent. Straight sinus, vein of Galen, and internal cerebral veins are patent. No appreciable cortical vein thrombosis. No appreciable abnormality about the cavernous sinus. Overall, appearance is similar to prior. IMPRESSION: 1. 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 Small volume nonocclusive thrombus within the right sigmoid sinus. Overall, appearance is similar to prior. To involve the left transverse and sigmoid sinuses as well as the left jugular bulb and proximal left internal jugular vein. 2. Otherwise normal brain MRI. No other acute intracranial abnormality. Electronically Signed   By: Rise Mu M.D.   On: 04/27/2023 02:21   CT HEAD CODE STROKE WO  CONTRAST  Result Date: 04/26/2023 CLINICAL DATA:  Code stroke. Initial evaluation for neuro deficit, stroke. EXAM: CT HEAD WITHOUT CONTRAST TECHNIQUE: Contiguous axial images were obtained from the base of the skull through the vertex without intravenous contrast. RADIATION DOSE REDUCTION: This exam was performed according to the departmental dose-optimization program which includes automated exposure control, adjustment of the mA and/or kV according to patient size and/or use of iterative reconstruction technique.  COMPARISON:  Prior study from 04/24/2023. FINDINGS: Brain: Cerebral volume within normal limits. No acute intracranial hemorrhage. No acute large vessel territory infarct. No complication related to recently identified dural venous sinus thrombosis identified. No mass lesion or midline shift. No hydrocephalus or extra-axial fluid collection. Vascular: No abnormal hyperdense vessel. Previously identified dural venous sinus thrombosis better evaluated on recent CT venogram. Skull: Scalp soft tissues and calvarium demonstrate no acute finding. Sinuses/Orbits: Globes orbital soft tissues within normal limits. Small right sphenoid sinus retention cyst. Paranasal sinuses are otherwise clear. No mastoid effusion. Other: None. ASPECTS [facility] Stroke Program Early CT Score) - Ganglionic level infarction (caudate, lentiform nuclei, internal capsule, insula, M1-M3 cortex): 7 - Supraganglionic infarction (M4-M6 cortex): 3 Total score (0-10 with 10 being normal): 10 IMPRESSION: 1. No acute intracranial abnormality. No intracranial hemorrhage or other complication related to recently identified dural venous sinus thrombosis. 2. ASPECTS is 10. These results were communicated to Dr. Amada Jupiter at 11:29 pm on 04/26/2023 by text page via the [facility] messaging system. Electronically Signed   By: Rise Mu M.D.   On: 04/26/2023 23:31   CT VENOGRAM HEAD  Result Date: 04/24/2023 CLINICAL DATA:  right sided  numbness. EXAM: CT HEAD WITHOUT CONTRAST CT VENOGRAM HEAD TECHNIQUE: Precontrast axial CT of the head with multiplanar reformats. Venographic phase images of the brain were obtained following the administration of intravenous contrast. Multiplanar reformats and maximum intensity projections were generated. RADIATION DOSE REDUCTION: This exam was performed according to the departmental dose-optimization program which includes automated exposure control, adjustment of the mA and/or kV according to patient size and/or use of iterative reconstruction technique. CONTRAST:  75mL OMNIPAQUE IOHEXOL 350 MG/ML SOLN COMPARISON:  None Available. FINDINGS: No acute intracranial hemorrhage. Gray-white differentiation is preserved. No hydrocephalus or extra-axial collection. No mass effect or midline shift. Partially empty sella. No hyperdense vessel. No calvarial fracture or suspicious bone lesion. Skull base is unremarkable. Mastoids, paranasal sinuses and orbits are unremarkable. Extensive thrombosis of the superior sagittal sinus extending into the transverse and sigmoid sinuses, as well as the upper left internal jugular vein. Left dominant transverse sinus. Possible small amount of thrombus in the right sigmoid sinus (image 115 series 8). IMPRESSION: Extensive thrombosis of the superior sagittal sinus extending into the transverse and sigmoid sinuses, as well as the upper left internal jugular vein. Possible small amount of thrombus in the right sigmoid sinus. Electronically Signed   By: Orvan Falconer M.D.   On: 04/24/2023 16:58    Summary:  RIGHT:  - Findings consistent with acute deep vein thrombosis involving the right  popliteal vein, right posterior tibial veins, and right peroneal veins.  - There is no evidence of superficial venous thrombosis.     - No cystic structure found in the popliteal fossa.     LEFT:  - No evidence of common femoral vein obstruction.   Factor V leiden Neg Prothrombin gene  neg Flow for PNH neg LA neg Beta 2 GP antibodies neg Mildly elevated Ig M ACL ab Ferritin of 32. Normal protein C and S activity Mildly elevated AT III activity   Total time spent: 30 minutes.   Rachel Moulds, MD 05/07/2023 2:16 PM

## 2023-05-14 ENCOUNTER — Other Ambulatory Visit: Payer: Self-pay | Admitting: *Deleted

## 2023-05-14 ENCOUNTER — Encounter: Payer: Self-pay | Admitting: Hematology and Oncology

## 2023-05-14 MED ORDER — APIXABAN 5 MG PO TABS: 5.0000 mg | ORAL_TABLET | Freq: Two times a day (BID) | ORAL | 3 refills | Status: AC

## 2023-05-15 ENCOUNTER — Telehealth: Payer: Self-pay | Admitting: Neurology

## 2023-05-15 NOTE — Telephone Encounter (Signed)
Pt's mother,[name] Krawiec said my daughter has sinus congestion is that related to her Cerebral venous sinus thrombosis. Would like a call from nurse.

## 2023-05-15 NOTE — Telephone Encounter (Signed)
Called the patient's mom back. Advised that sinus pressure headache sensation could be symptoms from the cerebral venous sinus thrombosis but the sinus congestion itself should not. It would be worth reaching out to her PCP to address this further and make sure abx or treatment isn't necessary. As I was talking she was unable to hear me but I could hear her. I ended the call, waited a second and called the pt's mom back. It went straight to VM. I reiterated what I stated above.  **IF mom calls back please advise its not likely that her sinus congestion is r/t to the cerebral venous sinus thrombosis

## 2023-05-21 ENCOUNTER — Telehealth: Payer: Self-pay | Admitting: Neurology

## 2023-05-21 NOTE — Telephone Encounter (Signed)
I called and spoke with pt regarding her concern of Topiramate raising Co2 levels. Advised pt per dr Dohmeier that yes it can lower the levels however is not a concern typically. Pt states that her Nephrologis Dr Ronalee Belts advised her to call her neurologist to ask if maybe her Topiramate is causing her Co2 levels to drop and if it is then to get a medication change. Pt will get a copy of her lab results and notes from her last office visit with him. Please advise.

## 2023-05-21 NOTE — Telephone Encounter (Signed)
Pt's mother called wanting to speak to the RN regarding some blood work her daughter had to do at another office. She states that her daughters CO2 level's were low and it might be due to the Topamax. Please advise.

## 2023-05-21 NOTE — Telephone Encounter (Signed)
Spoke with MD to clarify if this is a concern. Work in MD states that this can lower the blood Co2 levels but this is not a concern typically.

## 2023-05-26 ENCOUNTER — Encounter: Payer: Self-pay | Admitting: Neurology

## 2023-05-27 NOTE — Telephone Encounter (Signed)
Pt's mother is asking for a call from RN, she feels she is not getting full responses via my chart so she would like to discuss concerns by phone, please call her at  [phone] .

## 2023-05-27 NOTE — Telephone Encounter (Signed)
I called patient's mother, Molly Wise, per DPR. Patient's mother has many questions regarding the patient.  Patient's headaches have improved while using topamax but she would like to stop it and replace it with something else because she is worried about side effects. Patient missed her period this week and they want to know if topamax can cause this. Patient's mother denies any new medications or significant stressors in patient's life. This has already been discussed with patient but they want confirmation again that topamax can occasionally lower CO2 levels. Patient's nephrologist was concerned about this. 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  I advised her that Dr. Pearlean Brownie is out of the office and will discuss with the covering work in physician.

## 2023-05-28 NOTE — Telephone Encounter (Signed)
Please call patient and let her know   Thank you for your message seeking medical advice.* My assessment and recommendation are as follows: My answer would be   #1. If she would like a new medication, I would suggest an appointment where her physician or an NP can discuss the options. She can be scheduled with an NP first available if she would like to discuss other options.  #2. Yes, menstrual disorders are a potential side effect of Topamax but it also may be coincidental. She should not get pregnant on this medication due to teratogenicity risk.100mg  or less a day should not interfere with birth control.  #3 yes, it can #4 Make sure you are compliant with eliquis, never miss a dose. I would feel more comfortable if she would not fly until Dr. Pearlean Brownie repeats the MR or CT Venogram which he is planning to do 3 months after her clot. .While on the flight, There are some simple steps you can take to avoid developing a blood clot while flying. Make sure to stretch your legs and get some exercise. You can do this by walking around the plane every few hours and changing positions in your seat. Also stay hydrated.  #5 if she has further questions a phone call is not appropriate neither is a Clinical cytogeneticist message have her come for an appointment with Dr. Pearlean Brownie or an NP.   Sincerely,  Anson Fret, MD

## 2023-05-29 NOTE — Telephone Encounter (Signed)
Called pt's mother Ripley Fraise ok to do per DPR and relayed Dr Celene Squibb recommendations for pt. Pt's mother would like an appointment to discuss other options, she does not want pt to continue taking the Topamax. I will transfer call to phone room to get pt schedule for a follow up appointment. Pt's mother verbalized understanding. Pt had no additional questions at this time but was encouraged to call back if questions arise.

## 2023-06-06 ENCOUNTER — Ambulatory Visit: Payer: No Typology Code available for payment source | Admitting: Hematology and Oncology

## 2023-06-16 ENCOUNTER — Ambulatory Visit (INDEPENDENT_AMBULATORY_CARE_PROVIDER_SITE_OTHER): Payer: No Typology Code available for payment source | Admitting: Adult Health

## 2023-06-16 ENCOUNTER — Encounter: Payer: Self-pay | Admitting: Adult Health

## 2023-06-16 VITALS — BP 127/72 | HR 89 | Ht 66.0 in | Wt 235.0 lb

## 2023-06-16 DIAGNOSIS — G441 Vascular headache, not elsewhere classified: Secondary | ICD-10-CM

## 2023-06-16 DIAGNOSIS — G08 Intracranial and intraspinal phlebitis and thrombophlebitis: Secondary | ICD-10-CM | POA: Diagnosis not present

## 2023-06-16 MED ORDER — ZONISAMIDE 50 MG PO CAPS: 50.0000 mg | ORAL_CAPSULE | Freq: Two times a day (BID) | ORAL | 11 refills | Status: DC

## 2023-06-16 NOTE — Progress Notes (Signed)
[facility] [address] [place]. [place] [address] [phone]       OFFICE FOLLOW UP NOTE  [name] Date of Birth:  02-25-2001 Medical Record Number:  [id number]    Primary neurologist: Dr. Pearlean Brownie Reason for Referral: Cerebral venous sinus thrombosis  Chief Complaint  Patient presents with   Follow-up    RM 3, here with mother Molly Wise Pt is here to discuss different medication options instead of Topamax.      HPI:   Update 06/16/2023 JM: Returns for requested follow-up visit accompanied by her mother to discuss other medication options for headache prevention.   Patient started on topiramate at prior visit for continued headaches, previously on Diamox without improvement and reported paresthesias.  Reported some improvement of headaches on topiramate but concerned of potential side effects (menstrual disorder, hand and feet numbness and low CO2) and wishes to trial different medication.  Continues on topiramate at this time, reports headache current about every other day and not as severe. Denies any vision changes. 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 Lives in [place] and is scheduled with neurology in that area on 9/12 for transfer of care.  She has continued on Eliquis and routinely follows with oncology.     History provided for reference purposes only Consult visit 05/01/2023 Dr. Pearlean Brownie: Molly Wise is a 22 year old African-American lady seen today for initial office consultation visit.  She is open from the patient and review of electronic medical records in care everywhere.  Actual imaging films are not available but results were reviewed in PACS.  She has past medical history of nephrotic syndrome, lupus-like syndrome, pulmonary embolism and DVT.  She presented to the [facility] in [place] on 04/20/2023 with  1-1/2-week history of headaches, light sensitivity.  MRI scan showed abnormal signal intensity in the left sigmoid and transverse sinus suggestive of thrombosis.  CT angiogram showed large volume thrombus within the superior sagittal sinus, bilateral transverse and sigmoid sinuses.  She was started on IV heparin.  He was seen by hematology transferred to Eliquis at discharge.  Hypercoagulable panel lab work was done and she was found to have positive lupus anticoagulant.  Anticardiolipin antibodies and beta-2 glycoprotein were negative.  Urine drug screen was negative.  Patient presented to [facility] on 04/26/2023 with transient recurrence of numbness on the right side with tingling sensation and right facial droop and hand weakness.  Neurological exam is nonfocal except for blurred disc margins bilaterally.  MRI scan of the brain shows persistent dural venous sinus thrombosis involving majority of the superior sagittal sinus, left transverse and sigmoid sinuses extension into the left uvulopalatal and proximal left internal jugular vein.  Small volume nonocclusive thrombus was also noted in the right sigmoid sinus.  The echo showed normal ejection fraction of 65 to 70%.  Hemoglobin A1c was 5.6.  LDL cholesterol was 171 mg patient was not considered Eliquis failure as she had taken it only for a few days prior.  She was continued on it.  Will also started on Diamox She has not noticed any improvement in her headache and does not like the paresthesias she is getting better.  She does have prior history of DVT and pulm embolism in June 2022 following which she was placed on Eliquis for 6 months.  It was felt to be related to  nephrotic syndrome and autoimmune disorder related to that he had a positive ANA and dsDNA.  He is currently on prednisone and CellCept.  Today she states that she is has daily headaches which vary in severity from 4/10 to 7/10.  She does take Tylenol which has not helped.  She is also  taking Fioricet but is not sure it is helping.  Headache is somewhat improved, She does have some mild light and sound sensitivity and nausea and vomiting.  ROS:   14 system review of systems is positive for those listed in HPI and all other systems negative  PMH:  Past Medical History:  Diagnosis Date   Chronic kidney disease    DVT (deep venous thrombosis) (HCC)     Social History:  Social History   Socioeconomic History   Marital status: Single    Spouse name: Not on file   Number of children: Not on file   Years of education: Not on file   Highest education level: Not on file  Occupational History   Occupation: Theme park manager  Tobacco Use   Smoking status: Never   Smokeless tobacco: Never  Vaping Use   Vaping status: Never Used  Substance and Sexual Activity   Alcohol use: Never   Drug use: Never   Sexual activity: Not Currently  Other Topics Concern   Not on file  Social History Narrative   In college. Lives in [place] at school, and with Mother when out of school.   Social Determinants of Health   Financial Resource Strain: Not on file  Food Insecurity: Low Risk  (04/22/2023)   [facility]   Food vital sign    Within the past 12 months, you worried that your food would run out before you got money to buy more: Never true    Within the past 12 months, the food you bought just didn't last and you didn't have money to get more. : Never true  Transportation Needs: No Transportation Needs (04/27/2023)   PRAPARE - Administrator, Civil Service (Medical): No    Lack of Transportation (Non-Medical): No  Physical Activity: Not on file  Stress: Not on file  Social Connections: Not on file  Intimate Partner Violence: Not At Risk (04/27/2023)   Humiliation, Afraid, Rape, and Kick questionnaire    Fear of Current or Ex-Partner: No    Emotionally Abused: No    Physically Abused: No    Sexually Abused: No    Medications:    Current Outpatient Medications on File Prior to Visit  Medication Sig Dispense Refill   acetaminophen (TYLENOL) 500 MG tablet Take 1,000 mg by mouth every 6 (six) hours as needed for moderate pain.     apixaban (ELIQUIS) 5 MG TABS tablet Take 1 tablet (5 mg total) by mouth 2 (two) times daily. 180 tablet 3   empagliflozin (JARDIANCE) 10 MG TABS tablet Take 10 mg by mouth daily.     FEROSUL 325 (65 Fe) MG tablet Take 325 mg by mouth daily with breakfast.     losartan (COZAAR) 50 MG tablet Take 50 mg by mouth in the morning and at bedtime.     mycophenolate (CELLCEPT) 250 MG capsule Take 1,500 mg by mouth 2 (two) times daily.     rosuvastatin (CRESTOR) 10 MG tablet Take 10 mg by mouth daily.     topiramate (TOPAMAX) 50 MG tablet Take 1 tablet (50 mg total) by mouth 2 (two) times  daily. 60 tablet 3   Vitamin D, Ergocalciferol, (DRISDOL) 1.25 MG (50000 UNIT) CAPS capsule Take 50,000 Units by mouth every 7 (seven) days.     Voclosporin (LUPKYNIS) 7.9 MG CAPS Take 7.9 mg by mouth in the morning and at bedtime.     No current facility-administered medications on file prior to visit.    Allergies:  No Known Allergies  Physical Exam Today's Vitals   06/16/23 1525  BP: 127/72  Pulse: 89  Weight: 235 lb (106.6 kg)  Height: 5\' 6"  (1.676 m)   Body mass index is 37.93 kg/m.  General: Obese pleasant young African-American lady, seated, in no evident distress Head: head normocephalic and atraumatic.   Neck: supple with no carotid or supraclavicular bruits Cardiovascular: regular rate and rhythm, no murmurs Musculoskeletal: no deformity Skin:  no rash/petichiae Vascular:  Normal pulses all extremities  Neurologic Exam Mental Status: Awake and fully alert. Oriented to place and time. Recent and remote memory intact. Attention span, concentration and fund of knowledge appropriate. Mood and affect appropriate.  Cranial Nerves: Pupils equal, briskly reactive to light. Extraocular movements full  without nystagmus. Visual fields full to confrontation. Hearing intact. Facial sensation intact. Face, tongue, palate moves normally and symmetrically.  Motor: Normal bulk and tone. Normal strength in all tested extremity muscles. Sensory.: intact to touch , pinprick , position and vibratory sensation.  Coordination: Rapid alternating movements normal in all extremities. Finger-to-nose and heel-to-shin performed accurately bilaterally. Gait and Station: Arises from chair without difficulty. Stance is normal. Gait demonstrates normal stride length and balance Reflexes: 1+ and symmetric. Toes downgoing.      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  Prior history of DVT.  Patient doing well except for persistent headaches, improved some on topiramate but difficulty tolerating.    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 Denies vision changes.  Follow up with ophthalmology in September as scheduled Continue Eliquis for stroke prevention managed by oncology and continue routine follow-up Repeat CTV around 07/2023 - order will be sent to location in [place] (mother to follow up regarding location)    Is to be established with neurology in [place], [place] but mother/patient wishes to keep appointment here in October just in case, advised to cancel if no longer needed    I spent 40 minutes of face-to-face and non-face-to-face time with  patient and mother.  This included previsit chart review, lab review, study review, order entry, electronic health record documentation, patient and mother education and discussion regarding above diagnoses and treatment plan and answered all other questions to patient and mother's satisfaction  Ihor Austin, [facility]  [facility] [address] [place], [place] [address]  Phone [phone] Fax [phone] Note: This document was prepared with digital dictation and possible smart phrase technology. Any transcriptional errors that result from this process are unintentional.

## 2023-06-16 NOTE — Patient Instructions (Addendum)
Start zonisamide 50mg  twice daily for headache prevention - if difficulty tolerating morning dose, can take 2 tablets at night - please call with any difficulty tolerating. If headaches persist after 1-2 weeks, please call for dosage increase  Please let me know where imaging needs to be completed - will send order at that time  I will review report by ophthalmology and will let you know if we need to proceed with any further testing     Follow up with Dr. Pearlean Brownie as advised in October

## 2023-06-18 ENCOUNTER — Encounter: Payer: Self-pay | Admitting: Adult Health

## 2023-06-23 NOTE — Telephone Encounter (Signed)
Returned call to mother as requested, daughter present during conversation.  Multiple questions regarding indication for lumbar puncture, benefits and risks answered to both mother and patients satisfaction.  She was started on zonisamide 50 mg BID at visit last week, continues to have headaches but denies worsening and denies side effects. Denies any vision changes.  Declines interest in making adjustments at this time.  Patient and mother wish to further discuss procedure and will notify office tomorrow if they wish to proceed.

## 2023-06-23 NOTE — Telephone Encounter (Signed)
Called pt to answer her questions regarding the lumbar puncture that Dr Pearlean Brownie would like her to have. I answer her questions, then pt gave the phone to her mother Junius Roads and she stated she would like to speak to [facility] directly. I advised her that Shanda Bumps is in clinic all day and she might not be able to call her right away, she stated is fine. Pt's mother number is [phone].

## 2023-06-24 NOTE — Telephone Encounter (Signed)
Dr. Pearlean Brownie please call this patient back she has some questions [phone]

## 2023-06-24 NOTE — Telephone Encounter (Signed)
Lengthy discussion at recent in office visit regarding possible need of lumbar puncture for persistent headaches and mother reporting ophthalmology exam noting papilledema. After review of ophthalmology report, further reviewed with Dr. Pearlean Brownie who recommended pursuing LP. I spoke with mother and daughter yesterday afternoon for lengthy amount of time answering these questions already as well as multiple other questions. Dr. Pearlean Brownie, maybe with you speaking with them they will feel more comfortable either proceeding with lumbar puncture vs giving zonisamide more time to take effect vs waiting for repeat ophthalmology exam and CTV in September.

## 2023-06-25 ENCOUNTER — Encounter: Payer: Self-pay | Admitting: Adult Health

## 2023-06-25 ENCOUNTER — Encounter: Payer: Self-pay | Admitting: Neurology

## 2023-06-25 DIAGNOSIS — G08 Intracranial and intraspinal phlebitis and thrombophlebitis: Secondary | ICD-10-CM

## 2023-07-02 ENCOUNTER — Telehealth: Payer: Self-pay | Admitting: Adult Health

## 2023-07-02 NOTE — Telephone Encounter (Signed)
Scheduled 9/18 at 9am

## 2023-07-02 NOTE — Telephone Encounter (Signed)
[employer] medicaid Berkley Harvey: [id number] exp. 06/30/23-08/14/23 sent to [facility] in [place] per patient request. [phone]

## 2023-07-07 ENCOUNTER — Other Ambulatory Visit (HOSPITAL_COMMUNITY): Payer: Self-pay

## 2023-08-19 ENCOUNTER — Ambulatory Visit: Payer: No Typology Code available for payment source | Admitting: Neurology

## 2023-08-20 ENCOUNTER — Ambulatory Visit (INDEPENDENT_AMBULATORY_CARE_PROVIDER_SITE_OTHER): Payer: No Typology Code available for payment source | Admitting: Neurology

## 2023-08-20 ENCOUNTER — Encounter: Payer: Self-pay | Admitting: Neurology

## 2023-08-20 VITALS — BP 120/86 | HR 86 | Ht 66.0 in | Wt 232.0 lb

## 2023-08-20 DIAGNOSIS — I6389 Other cerebral infarction: Secondary | ICD-10-CM | POA: Diagnosis not present

## 2023-08-20 DIAGNOSIS — R519 Headache, unspecified: Secondary | ICD-10-CM

## 2023-08-20 DIAGNOSIS — H4710 Unspecified papilledema: Secondary | ICD-10-CM | POA: Diagnosis not present

## 2023-08-20 MED ORDER — ZONISAMIDE 50 MG PO CAPS: 100.0000 mg | ORAL_CAPSULE | Freq: Two times a day (BID) | ORAL | 11 refills | Status: DC

## 2023-08-20 NOTE — Patient Instructions (Signed)
I had a long discussion with the patient and her mother and discussed the results of follow-up CT angiogram and venogram as well as ophthalmological exam both of them showing improvement in the venous sinus thrombosis as well as papilledema.  I encouraged her to eat a healthy diet and lose weight and exercise regularly.  She had trouble tolerating Topamax.  I discussed possibility of Diamox which has similar side effects and since patient is improving recommend holding off for now.  If she has worsening vision symptoms may need to consider doing high-volume spinal tap instead.  I recommend she do regular neck stretching exercises and increase dose of zonisamide to 100 mg twice daily to help with her daily headaches.  Continue Eliquis for anticoagulation for a minimum period of 9 to 12 months.  Return for follow-up in 3 months with nurse practitioner Shanda Bumps or call earlier if necessary

## 2023-08-20 NOTE — Progress Notes (Addendum)
[facility] [address] [place]. [place] [address] [phone]       OFFICE FOLLOW UP NOTE  [name] Date of Birth:  2001-04-15 Medical Record Number:  [id number]    Primary neurologist: Dr. 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 No new concerns      HPI:  Update 08/20/2023 : She returns for follow-up after last visit with Shanda Bumps 2 months ago.  Patient reports persistent mild daily headaches which are mostly tolerable and she does not need medications.  Occasionally as the day gets severe.  She is currently on zonisamide 50 mg in the morning and 100 at night which she is tolerating well without side effects.  She had trouble tolerating Topamax due to subjective complaints of tingling fear of interfering with menses and hence discontinued it.  She was seen by neurologist and [facility] in [place] but I do not have those consultation visit notes.  [facility] was found to have positive lupus anticoagulant which he wants to repeat to see if it was real or acute phase reactant but wants her to stop Eliquis for a few days which I do not think is a good idea at the present time.  I recommend she did not stop it at least for 9 months at least.  She was seen by ophthalmology on 08/18/2023 who found resolving papilledema with greater than 50% improvement and decreased optic nerve swelling.  Patient knows she needs to eat healthy and lose weight and is working on it.  She has no new complaints.  CT [facility] brain on [facility] shows marked interval decrease in volume of previously demonstrated dural venous sinus thrombosis.  No residual thrombus appreciated in bilateral sigmoid to transverse sinuses.  There is fairly extensive residual nonocclusive thrombus  throughout much of the superior sagittal sinus though markedly diminished compared to previous study from 04/20/2023.  No new area of thrombus. Update 06/16/2023 JM: Returns for requested follow-up visit accompanied by her mother to discuss other medication options for headache prevention.   Patient started on topiramate at prior visit for continued headaches, previously on Diamox without improvement and reported paresthesias.  Reported some improvement of headaches on topiramate but concerned of potential side effects (menstrual disorder, hand and feet numbness and low CO2) and wishes to trial different medication.  Continues on topiramate at this time, reports headache current about every other day and not as severe. Denies any vision changes. Was seen by [facility] after prior visit and was told she had optic nerve swelling - mother called to have this report faxed to office for review. Lives in [place] and is scheduled with neurology in that area on 9/12 for transfer of care.  She has continued on Eliquis and routinely follows with oncology.     History provided for reference purposes only Consult visit 05/01/2023 Dr. Pearlean Brownie: [name] is a 22 year old African-American lady seen today for initial office consultation visit.  She is open from the patient and review of electronic medical records in care everywhere.  Actual imaging films are not available but results were reviewed in PACS.  She has past medical history of nephrotic syndrome, lupus-like syndrome, pulmonary embolism and DVT.  She presented to the [facility]  in [place] on 04/20/2023 with 1-1/2-week history of headaches, light sensitivity.  MRI scan showed abnormal signal intensity in the left sigmoid and transverse sinus suggestive of thrombosis.  CT angiogram showed large volume thrombus within the superior sagittal sinus, bilateral transverse and sigmoid sinuses.  She was started on IV heparin.  He was seen by  hematology transferred to Eliquis at discharge.  Hypercoagulable panel lab work was done and she was found to have positive lupus anticoagulant.  Anticardiolipin antibodies and beta-2 glycoprotein were negative.  Urine drug screen was negative.  Patient presented to [facility] on 04/26/2023 with transient recurrence of numbness on the right side with tingling sensation and right facial droop and hand weakness.  Neurological exam is nonfocal except for blurred disc margins bilaterally.  MRI scan of the brain shows persistent dural venous sinus thrombosis involving majority of the superior sagittal sinus, left transverse and sigmoid sinuses extension into the left uvulopalatal and proximal left internal jugular vein.  Small volume nonocclusive thrombus was also noted in the right sigmoid sinus.  The echo showed normal ejection fraction of 65 to 70%.  Hemoglobin A1c was 5.6.  LDL cholesterol was 171 mg patient was not considered Eliquis failure as she had taken it only for a few days prior.  She was continued on it.  Will also started on Diamox She has not noticed any improvement in her headache and does not like the paresthesias she is getting better.  She does have prior history of DVT and pulm embolism in June 2022 following which she was placed on Eliquis for 6 months.  It was felt to be related to nephrotic syndrome and autoimmune disorder related to that he had a positive ANA and dsDNA.  He is currently on prednisone and CellCept.  Today she states that she is has daily headaches which vary in severity from 4/10 to 7/10.  She does take Tylenol which has not helped.  She is also taking Fioricet but is not sure it is helping.  Headache is somewhat improved, She does have some mild light and sound sensitivity and nausea and vomiting.  ROS:   14 system review of systems is positive for those listed in HPI and all other systems negative  PMH:  Past Medical History:  Diagnosis Date   Chronic kidney  disease    DVT (deep venous thrombosis) (HCC)     Social History:  Social History   Socioeconomic History   Marital status: Single    Spouse name: Not on file   Number of children: Not on file   Years of education: Not on file   Highest education level: Not on file  Occupational History   Occupation: Theme park manager  Tobacco Use   Smoking status: Never   Smokeless tobacco: Never  Vaping Use   Vaping status: Never Used  Substance and Sexual Activity   Alcohol use: Never   Drug use: Never   Sexual activity: Not Currently  Other Topics Concern   Not on file  Social History Narrative   In college. Lives in [place] at school, and with Mother when out of school.   Social Determinants of Health   Financial Resource Strain: Not on file  Food Insecurity: Low Risk  (04/22/2023)   [facility]   Hunger Vital Sign    Worried About Running Out of Food in the Last Year: Never true    Ran Out of Food in the Last Year: Never  true  Transportation Needs: No Transportation Needs (04/27/2023)   PRAPARE - Administrator, Civil Service (Medical): No    Lack of Transportation (Non-Medical): No  Physical Activity: Not on file  Stress: Not on file  Social Connections: Not on file  Intimate Partner Violence: Not At Risk (04/27/2023)   Humiliation, Afraid, Rape, and Kick questionnaire    Fear of Current or Ex-Partner: No    Emotionally Abused: No    Physically Abused: No    Sexually Abused: No    Medications:   Current Outpatient Medications on File Prior to Visit  Medication Sig Dispense Refill   acetaminophen (TYLENOL) 500 MG tablet Take 1,000 mg by mouth every 6 (six) hours as needed for moderate pain.     apixaban (ELIQUIS) 5 MG TABS tablet Take 1 tablet (5 mg total) by mouth 2 (two) times daily. 180 tablet 3   cyclobenzaprine (FLEXERIL) 10 MG tablet Take by mouth.     empagliflozin (JARDIANCE) 10 MG TABS tablet Take 10 mg by mouth daily.      FEROSUL 325 (65 Fe) MG tablet Take 325 mg by mouth daily with breakfast.     losartan (COZAAR) 50 MG tablet Take 50 mg by mouth in the morning and at bedtime.     mycophenolate (CELLCEPT) 250 MG capsule Take 1,500 mg by mouth 2 (two) times daily.     rosuvastatin (CRESTOR) 10 MG tablet Take 10 mg by mouth daily.     Vitamin D, Ergocalciferol, (DRISDOL) 1.25 MG (50000 UNIT) CAPS capsule Take 50,000 Units by mouth every 7 (seven) days.     Voclosporin (LUPKYNIS) 7.9 MG CAPS Take 7.9 mg by mouth in the morning and at bedtime.     zonisamide (ZONEGRAN) 50 MG capsule Take 1 capsule (50 mg total) by mouth in the morning and at bedtime. (Patient taking differently: Take 50 mg by mouth 3 (three) times daily. 1 in morning, 2 at bedtime) 60 capsule 11   No current facility-administered medications on file prior to visit.    Allergies:  No Known Allergies  Physical Exam Today's Vitals   08/20/23 1540  BP: 120/86  Pulse: 86  Weight: 232 lb (105.2 kg)  Height: 5\' 6"  (1.676 m)   Body mass index is 37.45 kg/m.  General: Obese pleasant young African-American lady, seated, in no evident distress Head: head normocephalic and atraumatic.   Neck: supple with no carotid or supraclavicular bruits Cardiovascular: regular rate and rhythm, no murmurs Musculoskeletal: no deformity Skin:  no rash/petichiae Vascular:  Normal pulses all extremities  Neurologic Exam Mental Status: Awake and fully alert. Oriented to place and time. Recent and remote memory intact. Attention span, concentration and fund of knowledge appropriate. Mood and affect appropriate.  Cranial Nerves: Funduscopic exam shows sharp disc margins but slightly diminished venous pulsations.  Pupils equal, briskly reactive to light. Extraocular movements full without nystagmus. Visual fields full to confrontation. Hearing intact. Facial sensation intact. Face, tongue, palate moves normally and symmetrically.  Motor: Normal bulk and tone.  Normal strength in all tested extremity muscles. Sensory.: intact to touch , pinprick , position and vibratory sensation.  Coordination: Rapid alternating movements normal in all extremities. Finger-to-nose and heel-to-shin performed accurately bilaterally. Gait and Station: Arises from chair without difficulty. Stance is normal. Gait demonstrates normal stride length and balance Reflexes: 1+ and symmetric. Toes downgoing.      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  Prior history of DVT significantly resolving on follow-up imaging and papilledema also improved on last eye exam..  Patient doing well except for persistent headaches, improved some on topiramate but difficulty tolerating.    I had a long discussion with the patient and her mother and discussed the results of follow-up CT angiogram and venogram as well as ophthalmological exam both of them showing improvement in the venous sinus thrombosis as well as papilledema.  I encouraged her to eat a healthy diet and lose weight and exercise regularly.  She had trouble tolerating Topamax.  I discussed possibility of Diamox which has similar side effects and since patient is improving recommend holding off for now.  If she has worsening vision symptoms may need to consider doing high-volume spinal tap instead.  I recommend she do regular neck stretching exercises and increase dose of zonisamide to 100 mg twice daily to help with her daily headaches.  Continue Eliquis for anticoagulation for a minimum period of 9 to 12 months.  Continue 3 monthly follow-up with ophthalmology for papilledema.  Return for follow-up in 3 months with nurse practitioner Shanda Bumps or call earlier if necessary.  Greater than 50% time during this 35-minute visit was spent in counseling and coordination of care  about her resolving cerebral venous sinus thrombosis and residual papilledema and headaches and answering questions Delia Heady, MD  [facility] [address] [place], [place] [address]  Phone [phone] Fax [phone] Note: This document was prepared with digital dictation and possible smart phrase technology. Any transcriptional errors that result from this process are unintentional.

## 2023-10-14 ENCOUNTER — Ambulatory Visit: Payer: No Typology Code available for payment source | Admitting: Neurology

## 2023-11-20 ENCOUNTER — Ambulatory Visit: Payer: No Typology Code available for payment source | Admitting: Adult Health

## 2023-11-20 ENCOUNTER — Encounter: Payer: Self-pay | Admitting: Adult Health

## 2023-11-20 VITALS — BP 127/78 | HR 81 | Ht 66.0 in | Wt 235.0 lb

## 2023-11-20 DIAGNOSIS — G932 Benign intracranial hypertension: Secondary | ICD-10-CM | POA: Diagnosis not present

## 2023-11-20 DIAGNOSIS — I6389 Other cerebral infarction: Secondary | ICD-10-CM | POA: Diagnosis not present

## 2023-11-20 MED ORDER — ZONISAMIDE 50 MG PO CAPS: 150.0000 mg | ORAL_CAPSULE | Freq: Two times a day (BID) | ORAL | 11 refills | Status: DC

## 2023-11-20 NOTE — Progress Notes (Signed)
 [facility] [address] [place]. [place] [address] [phone]       OFFICE FOLLOW UP NOTE  Molly Wise Date of Birth:  2000/11/20 Medical Record Number:  [id number]    Primary neurologist: Dr. 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 Patient states she still get a lot of headaches with a painful pressure.     HPI:   Update 11/20/2023 JM: Patient returns for stroke follow-up visit after prior visit with Dr. Rosemarie 3 months ago.  She reports continued headaches, typically about every other day. Usually occurs mid afternoon. Not debilitating. Feels pressure behind her eyes.  Currently on zonisamide  100 mg twice daily, tolerating without side effects.  Recent visit with optometrist at [facility] on 12/6 who noted continued improvement of papilledema currently at about 75% improvement per mother report. Has f/u visit in March. Has had f/u with oncology since prior visit with repeat lab work which confirmed positive lupus anticoagulant, recommended lifelong use of Eliquis  in setting of recurrent blood clots.  Has follow-up visit scheduled in June.      History provided for reference purposes only Update 08/20/2023 Dr. Rosemarie: She returns for follow-up after last visit with Molly 2 months ago.  Patient reports persistent mild daily headaches which are mostly tolerable and she does not need medications.  Occasionally as the day gets severe.  She is currently on zonisamide  50 mg in the morning and 100 at night which she is tolerating well without side effects.  She had trouble tolerating Topamax  due to subjective complaints of tingling fear of interfering with menses and hence discontinued it.  She was seen by neurologist and [facility] in [place] but I do not have those consultation visit notes.  [facility] was found  to have positive lupus anticoagulant which he wants to repeat to see if it was real or acute phase reactant but wants her to stop Eliquis  for a few days which I do not think is a good idea at the present time.  I recommend she did not stop it at least for 9 months at least.  She was seen by ophthalmology on 08/18/2023 who found resolving papilledema with greater than 50% improvement and decreased optic nerve swelling.  Patient knows she needs to eat healthy and lose weight and is working on it.  She has no new complaints.  CT angiogram of the brain on 07/18/2023 at [facility] shows marked interval decrease in volume of previously demonstrated dural venous sinus thrombosis.  No residual thrombus appreciated in bilateral sigmoid to transverse sinuses.  There is fairly extensive residual nonocclusive thrombus throughout much of the superior sagittal sinus though markedly diminished compared to previous study from 04/20/2023.  No new area of thrombus.   Update 06/16/2023 JM: Returns for requested follow-up visit accompanied by her mother to discuss other medication options for headache prevention.   Patient started on topiramate  at prior visit for continued headaches, previously on Diamox  without improvement and reported paresthesias.  Reported some improvement of headaches on topiramate  but concerned of potential side effects (menstrual disorder, hand and feet numbness and low CO2) and wishes to trial different medication.  Continues on topiramate  at this time, reports headache current about every other day and not as severe. Denies any vision changes. Was seen by [facility] after prior visit  and was told she had optic nerve swelling - mother called to have this report faxed to office for review. Lives in [place] and is scheduled with neurology in that area on 9/12 for transfer of care.  She has continued on Eliquis  and routinely follows with oncology.  Consult visit 05/01/2023 Dr. Rosemarie: Molly Wise is a  23 year old African-American lady seen today for initial office consultation visit.  She is open from the patient and review of electronic medical records in care everywhere.  Actual imaging films are not available but results were reviewed in PACS.  She has past medical history of nephrotic syndrome, lupus-like syndrome, pulmonary embolism and DVT.  She presented to the [facility] in [place] on 04/20/2023 with 1-1/2-week history of headaches, light sensitivity.  MRI scan showed abnormal signal intensity in the left sigmoid and transverse sinus suggestive of thrombosis.  CT angiogram showed large volume thrombus within the superior sagittal sinus, bilateral transverse and sigmoid sinuses.  She was started on IV heparin .  He was seen by hematology transferred to Eliquis  at discharge.  Hypercoagulable panel lab work was done and she was found to have positive lupus anticoagulant.  Anticardiolipin antibodies and beta-2 glycoprotein were negative.  Urine drug screen was negative.  Patient presented to [facility] on 04/26/2023 with transient recurrence of numbness on the right side with tingling sensation and right facial droop and hand weakness.  Neurological exam is nonfocal except for blurred disc margins bilaterally.  MRI scan of the brain shows persistent dural venous sinus thrombosis involving majority of the superior sagittal sinus, left transverse and sigmoid sinuses extension into the left uvulopalatal and proximal left internal jugular vein.  Small volume nonocclusive thrombus was also noted in the right sigmoid sinus.  The echo showed normal ejection fraction of 65 to 70%.  Hemoglobin A1c was 5.6.  LDL cholesterol was 171 mg patient was not considered Eliquis  failure as she had taken it only for a few days prior.  She was continued on it.  Will also started on Diamox  She has not noticed any improvement in her headache and does not like the paresthesias she is getting better.  She does  have prior history of DVT and pulm embolism in June 2022 following which she was placed on Eliquis  for 6 months.  It was felt to be related to nephrotic syndrome and autoimmune disorder related to that he had a positive ANA and dsDNA.  He is currently on prednisone  and CellCept .  Today she states that she is has daily headaches which vary in severity from 4/10 to 7/10.  She does take Tylenol  which has not helped.  She is also taking Fioricet  but is not sure it is helping.  Headache is somewhat improved, She does have some mild light and sound sensitivity and nausea and vomiting.    ROS:   14 system review of systems is positive for those listed in HPI and all other systems negative  PMH:  Past Medical History:  Diagnosis Date   Chronic kidney disease    DVT (deep venous thrombosis) (HCC)     Social History:  Social History   Socioeconomic History   Marital status: Single    Spouse name: Not on file   Number of children: Not on file   Years of education: Not on file   Highest education level: Not on file  Occupational History   Occupation: Theme [employer] Manager  Tobacco Use   Smoking status: Never   Smokeless  tobacco: Never  Vaping Use   Vaping status: Never Used  Substance and Sexual Activity   Alcohol use: Never   Drug use: Never   Sexual activity: Not Currently  Other Topics Concern   Not on file  Social History Narrative   In college. Lives in [place] at [place], and with Mother when out of school.   Social Drivers of Corporate Investment Banker Strain: Not on file  Food Insecurity: Low Risk  (04/22/2023)   [facility]   Hunger Vital Sign    Worried About Running Out of Food in the Last Year: Never true    Ran Out of Food in the Last Year: Never true  Transportation Needs: No Transportation Needs (04/27/2023)   PRAPARE - Administrator, Civil Service (Medical): No    Lack of Transportation (Non-Medical): No  Physical  Activity: Not on file  Stress: Not on file  Social Connections: Not on file  Intimate Partner Violence: Not At Risk (04/27/2023)   Humiliation, Afraid, Rape, and Kick questionnaire    Fear of Current or Ex-Partner: No    Emotionally Abused: No    Physically Abused: No    Sexually Abused: No    Medications:   Current Outpatient Medications on File Prior to Visit  Medication Sig Dispense Refill   acetaminophen  (TYLENOL ) 500 MG tablet Take 1,000 mg by mouth every 6 (six) hours as needed for moderate pain.     apixaban  (ELIQUIS ) 5 MG TABS tablet Take 1 tablet (5 mg total) by mouth 2 (two) times daily. 180 tablet 3   cyclobenzaprine (FLEXERIL) 10 MG tablet Take by mouth.     empagliflozin (JARDIANCE) 10 MG TABS tablet Take 10 mg by mouth daily.     FEROSUL 325 (65 Fe) MG tablet Take 325 mg by mouth daily with breakfast.     losartan  (COZAAR ) 50 MG tablet Take 50 mg by mouth in the morning and at bedtime.     mycophenolate  (CELLCEPT ) 250 MG capsule Take 1,500 mg by mouth 2 (two) times daily.     Vitamin D, Ergocalciferol, (DRISDOL) 1.25 MG (50000 UNIT) CAPS capsule Take 50,000 Units by mouth every 7 (seven) days.     Voclosporin (LUPKYNIS) 7.9 MG CAPS Take 7.9 mg by mouth in the morning and at bedtime.     zonisamide  (ZONEGRAN ) 50 MG capsule Take 2 capsules (100 mg total) by mouth in the morning and at bedtime. 60 capsule 11   rosuvastatin  (CRESTOR ) 10 MG tablet Take 10 mg by mouth daily. (Patient not taking: Reported on 11/20/2023)     No current facility-administered medications on file prior to visit.    Allergies:  No Known Allergies  Physical Exam Today's Vitals   11/20/23 1555  BP: 127/78  Pulse: 81  Weight: 235 lb (106.6 kg)  Height: 5' 6 (1.676 m)   Body mass index is 37.93 kg/m.  General: Obese pleasant young African-American lady, seated, in no evident distress  Neurologic Exam Mental Status: Awake and fully alert. Oriented to place and time. Recent and remote memory  intact. Attention span, concentration and fund of knowledge appropriate. Mood and affect appropriate.  Cranial Nerves: Extraocular movements full without nystagmus. Visual fields full to confrontation. Hearing intact. Facial sensation intact. Face, tongue, palate moves normally and symmetrically.  Motor: Normal bulk and tone. Normal strength in all tested extremity muscles. Sensory.: intact to touch , pinprick , position and vibratory sensation.  Coordination: Rapid alternating  movements normal in all extremities. Finger-to-nose and heel-to-shin performed accurately bilaterally. Gait and Station: Arises from chair without difficulty. Stance is normal. Gait demonstrates normal stride length and balance Reflexes: 1+ and symmetric. Toes downgoing.        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  Prior history of DVT.  Patient doing well except for persistent headaches. During hospitalization, concern of possible IIH, saw outpatient optometrist who confirmed papilledema.     CSVT IIH Increase zonisamide  from 100mg  BID to 150mg  BID (will increase night time dose to 150mg  first for 2 weeks then increase morning dose) Advised to call if headaches persist, can consider adjunctive therapy Intolerant to topiramate  (menstrual disorder, paresthesias, low Co2 although levels were on the lower side prior to starting) and Diamox  (paresthesias) Will request optometrist report from recent visit - per mother, continued improvement of papilledema Continue Eliquis  which is recommended long term, managed/rx'd by oncology     Follow up in 6 months or call earlier if needed    I spent 30 minutes of face-to-face and non-face-to-face time with patient and mother.  This included previsit chart review, lab  review, study review, order entry, electronic health record documentation, patient and mother education and discussion regarding above diagnoses and treatment plan and answered all other questions to patient and mother's satisfaction  Molly Wise, [facility]  [facility] [address] [place], [place] [address]  Phone [phone] Fax [phone] Note: This document was prepared with digital dictation and possible smart phrase technology. Any transcriptional errors that result from this process are unintentional.

## 2023-11-20 NOTE — Patient Instructions (Addendum)
 Your Plan:  Increase zonisamide  dose - recommend continuing with 2 capsules in the morning and increasing to 3 capsules at night for 2 weeks then increases to 3 capsules twice daily   Continue Eliquis  life long as recommend by hematologist   Will request office visit notes from your recent visit with your eye doctor       Follow up in 6 months or call earlier if needed      Thank you for coming to see us  at [facility]. I hope we have been able to provide you high quality care today.  You may receive a patient satisfaction survey over the next few weeks. We would appreciate your feedback and comments so that we may continue to improve ourselves and the health of our patients.

## 2023-11-24 ENCOUNTER — Encounter: Payer: Self-pay | Admitting: Adult Health

## 2024-03-22 ENCOUNTER — Encounter: Payer: Self-pay | Admitting: Adult Health

## 2024-03-22 ENCOUNTER — Encounter: Payer: Self-pay | Admitting: Neurology

## 2024-03-23 NOTE — Telephone Encounter (Signed)
 Can you contact patient to schedule f/u visit with Dr. Janett Medin for new right sided symptoms? Thank you!

## 2024-03-23 NOTE — Telephone Encounter (Signed)
 Patient scheduled to see Dr. Janett Medin for 05/25/24 at 4pm and appt added to wait list

## 2024-04-13 ENCOUNTER — Encounter: Payer: Self-pay | Admitting: Adult Health

## 2024-04-13 ENCOUNTER — Encounter: Payer: Self-pay | Admitting: Neurology

## 2024-04-13 ENCOUNTER — Ambulatory Visit (INDEPENDENT_AMBULATORY_CARE_PROVIDER_SITE_OTHER): Admitting: Neurology

## 2024-04-13 VITALS — BP 134/83 | HR 87 | Ht 66.0 in | Wt 234.0 lb

## 2024-04-13 DIAGNOSIS — D6862 Lupus anticoagulant syndrome: Secondary | ICD-10-CM | POA: Diagnosis not present

## 2024-04-13 DIAGNOSIS — Z8673 Personal history of transient ischemic attack (TIA), and cerebral infarction without residual deficits: Secondary | ICD-10-CM | POA: Diagnosis not present

## 2024-04-13 DIAGNOSIS — R200 Anesthesia of skin: Secondary | ICD-10-CM | POA: Diagnosis not present

## 2024-04-13 DIAGNOSIS — Z86718 Personal history of other venous thrombosis and embolism: Secondary | ICD-10-CM

## 2024-04-13 DIAGNOSIS — G44221 Chronic tension-type headache, intractable: Secondary | ICD-10-CM

## 2024-04-13 MED ORDER — TIZANIDINE HCL 2 MG PO CAPS: 2.0000 mg | ORAL_CAPSULE | Freq: Two times a day (BID) | ORAL | 3 refills | Status: AC

## 2024-04-13 NOTE — Patient Instructions (Signed)
 I had a long discussion with the patient regarding her chronic daily headaches which seem like refractory muscle tension headaches.  Recent episode of right face and right body paresthesias which remains of unclear etiology with negative neurovascular imaging.  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  Trial of Zanaflex 2 mg at night for 1 week to be increased to twice daily if tolerated and subsequently higher doses if necessary.  Taper zonisamide  to 50 mg twice daily for a week and then once daily for a week and discontinue.  I recommend patient do regular neck stretching exercises and participate in regular activities for stress relaxation like regular exercise, meditation and yoga.  Return for follow-up in 3 months with Camilo Cella nurse practitioner or call earlier if necessary.  Neck Exercises Ask your health care provider which exercises are safe for you. Do exercises exactly as told by your health care provider and adjust them as directed. It is normal to feel mild stretching, pulling, tightness, or discomfort as you do these exercises. Stop right away if you feel sudden pain or your pain gets worse. Do not begin these exercises until told by your health care provider. Neck exercises can be important for many reasons. They can improve strength and maintain flexibility in your neck, which will help your upper back and prevent neck pain. Stretching exercises Rotation neck stretching  Sit in a chair or stand up. Place your feet flat on the floor, shoulder-width apart. Slowly turn your head (rotate) to the right until a slight stretch is felt. Turn it all the way to the right so you can look over your right shoulder. Do not tilt or tip your head. Hold this position for 10-30 seconds. Slowly turn your head (rotate) to the left until a slight stretch is felt. Turn it all the way to the left so you can look  over your left shoulder. Do not tilt or tip your head. Hold this position for 10-30 seconds. Repeat __________ times. Complete this exercise __________ times a day. Neck retraction  Sit in a sturdy chair or stand up. Look straight ahead. Do not bend your neck. Use your fingers to push your chin backward (retraction). Do not bend your neck for this movement. Continue to face straight ahead. If you are doing the exercise properly, you will feel a slight sensation in your throat and a stretch at the back of your neck. Hold the stretch for 1-2 seconds. Repeat __________ times. Complete this exercise __________ times a day. Strengthening exercises Neck press  Lie on your back on a firm bed or on the floor with a pillow under your head. Use your neck muscles to push your head down on the pillow and straighten your spine. Hold the position as well as you can. Keep your head facing up (in a neutral position) and your chin tucked. Slowly count to 5 while holding this position. Repeat __________ times. Complete this exercise __________ times a day. Isometrics These are exercises in which you strengthen the muscles in your neck while keeping your neck still (isometrics). Sit in a supportive chair and place your hand on your forehead. Keep your head and face facing straight ahead. Do not flex or extend your neck while doing isometrics. Push forward with your head and neck while pushing back with your hand. Hold for 10 seconds. Do the sequence again, this time putting your hand against the back of your  head. Use your head and neck to push backward against the hand pressure. Finally, do the same exercise on either side of your head, pushing sideways against the pressure of your hand. Repeat __________ times. Complete this exercise __________ times a day. Prone head lifts  Lie face-down (prone position), resting on your elbows so that your chest and upper back are raised. Start with your head facing  downward, near your chest. Position your chin either on or near your chest. Slowly lift your head upward. Lift until you are looking straight ahead. Then continue lifting your head as far back as you can comfortably stretch. Hold your head up for 5 seconds. Then slowly lower it to your starting position. Repeat __________ times. Complete this exercise __________ times a day. Supine head lifts  Lie on your back (supine position), bending your knees to point to the ceiling and keeping your feet flat on the floor. Lift your head slowly off the floor, raising your chin toward your chest. Hold for 5 seconds. Repeat __________ times. Complete this exercise __________ times a day. Scapular retraction  Stand with your arms at your sides. Look straight ahead. Slowly pull both shoulders (scapulae) backward and downward (retraction) until you feel a stretch between your shoulder blades in your upper back. Hold for 10-30 seconds. Relax and repeat. Repeat __________ times. Complete this exercise __________ times a day. Contact a health care provider if: Your neck pain or discomfort gets worse when you do an exercise. Your neck pain or discomfort does not improve within 2 hours after you exercise. If you have any of these problems, stop exercising right away. Do not do the exercises again unless your health care provider says that you can. Get help right away if: You develop sudden, severe neck pain. If this happens, stop exercising right away. Do not do the exercises again unless your health care provider says that you can. This information is not intended to replace advice given to you by your health care provider. Make sure you discuss any questions you have with your health care provider. Document Revised: 05/01/2021 Document Reviewed: 05/01/2021 Elsevier Patient Education  2024 [employer].

## 2024-04-13 NOTE — Progress Notes (Signed)
 [facility] [address] [place]. [place] [address] [phone]       OFFICE FOLLOW UP NOTE  Molly Wise Date of Birth:  [birth year] Medical Record Number:  [id number]    Primary neurologist: Dr. Janett Medin Reason for Referral: Cerebral venous sinus thrombosis  Chief Complaint  Patient presents with   Follow-up    Pt alone, may 1st exp numbness right side face/arm. Went to ER in [facility] MRV was completed when she went to ER. 5/8 the numbness went into the RLE. Went to ER again and completed CT venogram as well as MRI thoracic. (In care everywhere)     HPI:   Update 04/13/2024 : Patient is seen today upon request from primary care physician and [facility] nurse practitioner for new episode of right-sided numbness.  Patient was seen at urgent care in [place] on 03/19/2023 for sudden onset of right face upper extremity and right leg below the knee numbness.  She underwent extensive workup including MRI scan of the brain and MR angiogram of the brain and neck all of which were negative.  I have reviewed all imaging studies in care everywhere.  Basic lab work was negative.  She was discharged home but readmitted on 03/25/2024 for worsening of right-sided paresthesias.  At this time MRI scan of the brain, cervical spine, thoracic spine and lumbar spine were obtained and all of which were unremarkable.  CT venogram was obtained on 03/25/2024 which showed no evidence of venous sinus thrombosis.  Patient was continued on Eliquis  which she takes because of her history of positive lupus anticoagulant and history of cerebral venous sinus thrombosis in June 2024.  Patient states she still has persistent right face and arm and leg paresthesias are not bothersome.  She continues to have almost daily headaches which occur every other day.  Headaches last for an hour or 2.  They are mild to moderate maximum in severity.  Mostly pressure-like but occasionally throbbing.  There is no  accompanying nausea vomiting light or sound sensitivity.  Headaches are not really disabling.  She has previously tried Topamax , Diamox  not found significant relief.  More recently she is on Zonegran  and at last visit the dose was increased to 150 twice daily which is not helping.  She is currently tapering down is not working.  Some tightness and stiffness in the neck and the job mostly sitting in a chair.  She is not sure if her posture is good. Update 08/20/2023 : She returns for follow-up after last visit with Camilo Cella 2 months ago.  Patient reports persistent mild daily headaches which are mostly tolerable and she does not need medications.  Occasionally as the day gets severe.  She is currently on zonisamide  50 mg in the morning and 100 at night which she is tolerating well without side effects.  She had trouble tolerating Topamax  due to subjective complaints of tingling fear of interfering with menses and hence discontinued it.  She was seen by neurologist and [facility] in [place] but I do not have those consultation visit notes.  [facility] was found to have positive lupus anticoagulant which he wants to repeat to see if it was real or acute phase reactant but wants her to stop Eliquis  for a few days which I do not think is a good idea at the present time.  I recommend she did not stop it at least for 9 months at least.  She was  seen by ophthalmology on 08/18/2023 who found resolving papilledema with greater than 50% improvement and decreased optic nerve swelling.  Patient knows she needs to eat healthy and lose weight and is working on it.  She has no new complaints.  CT angiogram of the brain on 07/18/2023 at [facility] shows marked interval decrease in volume of previously demonstrated dural venous sinus thrombosis.  No residual thrombus appreciated in bilateral sigmoid to transverse sinuses.  There is fairly extensive residual nonocclusive thrombus throughout much of the  superior sagittal sinus though markedly diminished compared to previous study from 04/20/2023.  No new area of thrombus. Update 06/16/2023 JM: Returns for requested follow-up visit accompanied by her mother to discuss other medication options for headache prevention.   Patient started on topiramate  at prior visit for continued headaches, previously on Diamox  without improvement and reported paresthesias.  Reported some improvement of headaches on topiramate  but concerned of potential side effects (menstrual disorder, hand and feet numbness and low CO2) and wishes to trial different medication.  Continues on topiramate  at this time, reports headache current about every other day and not as severe. Denies any vision changes. Was seen by [facility] after prior visit and was told she had optic nerve swelling - mother called to have this report faxed to office for review. Lives in [place] and is scheduled with neurology in that area on 9/12 for transfer of care.  She has continued on Eliquis  and routinely follows with oncology.     History provided for reference purposes only Consult visit 05/01/2023 Dr. Janett Medin: [name] is a 23 year old African-American lady seen today for initial office consultation visit.  She is open from the patient and review of electronic medical records in care everywhere.  Actual imaging films are not available but results were reviewed in PACS.  She has past medical history of nephrotic syndrome, lupus-like syndrome, pulmonary embolism and DVT.  She presented to the [facility] in [place] on 04/20/2023 with 1-1/2-week history of headaches, light sensitivity.  MRI scan showed abnormal signal intensity in the left sigmoid and transverse sinus suggestive of thrombosis.  CT angiogram showed large volume thrombus within the superior sagittal sinus, bilateral transverse and sigmoid sinuses.  She was started on IV heparin .  He was seen by hematology transferred to  [facility]  at discharge.  Hypercoagulable panel lab work was done and she was found to have positive lupus anticoagulant.  Anticardiolipin antibodies and beta-2 glycoprotein were negative.  Urine drug screen was negative.  Patient presented to [facility] on 04/26/2023 with transient recurrence of numbness on the right side with tingling sensation and right facial droop and hand weakness.  Neurological exam is nonfocal except for blurred disc margins bilaterally.  MRI scan of the brain shows persistent dural venous sinus thrombosis involving majority of the superior sagittal sinus, left transverse and sigmoid sinuses extension into the left uvulopalatal and proximal left internal jugular vein.  Small volume nonocclusive thrombus was also noted in the right sigmoid sinus.  The echo showed normal ejection fraction of 65 to 70%.  Hemoglobin A1c was 5.6.  LDL cholesterol was 171 mg patient was not considered Eliquis  failure as she had taken it only for a few days prior.  She was continued on it.  Will also started on Diamox  She has not noticed any improvement in her headache and does not like the paresthesias she is getting better.  She does have prior history of DVT and pulm embolism in June 2022  following which she was placed on Eliquis  for 6 months.  It was felt to be related to nephrotic syndrome and autoimmune disorder related to that he had a positive ANA and dsDNA.  He is currently on prednisone  and CellCept .  Today she states that she is has daily headaches which vary in severity from 4/10 to 7/10.  She does take Tylenol  which has not helped.  She is also taking Fioricet  but is not sure it is helping.  Headache is somewhat improved, She does have some mild light and sound sensitivity and nausea and vomiting.  ROS:   14 system review of systems is positive for those listed in HPI and all other systems negative  PMH:  Past Medical History:  Diagnosis Date   Chronic kidney disease    DVT (deep venous  thrombosis) (HCC)     Social History:  Social History   Socioeconomic History   Marital status: Single    Spouse name: Not on file   Number of children: Not on file   Years of education: Not on file   Highest education level: Not on file  Occupational History   Occupation: Theme park manager  Tobacco Use   Smoking status: Never   Smokeless tobacco: Never  Vaping Use   Vaping status: Never Used  Substance and Sexual Activity   Alcohol use: Never   Drug use: Never   Sexual activity: Not Currently  Other Topics Concern   Not on file  Social History Narrative   In college. Lives in [place] at school, and with Mother when out of school.   Social Drivers of Corporate investment banker Strain: Not on file  Food Insecurity: Low Risk  (04/06/2024)   Received from [facility]   Hunger Vital Sign    Worried About Running Out of Food in the Last Year: Never true    Ran Out of Food in the Last Year: Never true  Transportation Needs: No Transportation Needs (04/06/2024)   Received from [facility]    In the past 12 months, has lack of reliable transportation kept you from medical appointments, meetings, work or from getting things needed for daily living? : No  Physical Activity: Not on file  Stress: Not on file  Social Connections: Not on file  Intimate Partner Violence: Not At Risk (04/27/2023)   Humiliation, Afraid, Rape, and Kick questionnaire    Fear of Current or Ex-Partner: No    Emotionally Abused: No    Physically Abused: No    Sexually Abused: No    Medications:   Current Outpatient Medications on File Prior to Visit  Medication Sig Dispense Refill   acetaminophen  (TYLENOL ) 500 MG tablet Take 1,000 mg by mouth every 6 (six) hours as needed for moderate pain.     apixaban  (ELIQUIS ) 5 MG TABS tablet Take 1 tablet (5 mg total) by mouth 2 (two) times daily. 180 tablet 3   empagliflozin (JARDIANCE) 10 MG TABS tablet Take 10 mg by mouth daily.      FEROSUL 325 (65 Fe) MG tablet Take 325 mg by mouth daily with breakfast.     losartan  (COZAAR ) 50 MG tablet Take 50 mg by mouth in the morning and at bedtime.     mycophenolate  (CELLCEPT ) 250 MG capsule Take 1,500 mg by mouth 2 (two) times daily.     Vitamin D, Ergocalciferol, (DRISDOL) 1.25 MG (50000 UNIT) CAPS capsule Take 50,000 Units by mouth every 7 (seven) days.  Voclosporin (LUPKYNIS) 7.9 MG CAPS Take 7.9 mg by mouth in the morning and at bedtime.     No current facility-administered medications on file prior to visit.    Allergies:  No Known Allergies  Physical Exam Today's Vitals   04/13/24 1033  BP: 134/83  Pulse: 87  Weight: 234 lb (106.1 kg)  Height: 5\' 6"  (1.676 m)   Body mass index is 37.77 kg/m.  General: Obese pleasant young African-American lady, seated, in no evident distress Head: head normocephalic and atraumatic.   Neck: supple with no carotid or supraclavicular bruits Cardiovascular: regular rate and rhythm, no murmurs Musculoskeletal: no deformity Skin:  no rash/petichiae Vascular:  Normal pulses all extremities  Neurologic Exam Mental Status: Awake and fully alert. Oriented to place and time. Recent and remote memory intact. Attention span, concentration and fund of knowledge appropriate. Mood and affect appropriate.  Cranial Nerves: Funduscopic exam shows sharp disc margins    Pupils equal, briskly reactive to light. Extraocular movements full without nystagmus. Visual fields full to confrontation. Hearing intact. Facial sensation intact. Face, tongue, palate moves normally and symmetrically.  Motor: Normal bulk and tone. Normal strength in all tested extremity muscles. Sensory.: intact to touch , pinprick , position and vibratory sensation.  Coordination: Rapid alternating movements normal in all extremities. Finger-to-nose and heel-to-shin performed accurately bilaterally. Gait and Station: Arises from chair without difficulty. Stance is normal. Gait  demonstrates normal stride length and balance Reflexes: 1+ and symmetric. Toes downgoing.      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  Prior history of DVT significantly resolving on follow-up imaging and papilledema also improved on last eye exam..   Patient with chronic almost daily headaches and has not responded to Topamax , Diamox  and Zonegran .  New episode of right face arm and leg paresthesias of unclear etiology with negative neurovascular imaging x 2 with nonorganic pattern of sensory loss..    I had a long discussion with the patient and her mother whom I spoke to over the phone regarding her chronic daily headaches which seem like refractory muscle tension headaches.  Recent episode of right face and right body paresthesias which remains of unclear etiology with negative neurovascular imaging.  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  Trial of Zanaflex 2 mg at night for 1 week to be increased to twice daily if tolerated and subsequently higher doses if necessary.  Taper zonisamide  to 50 mg twice daily for a week and then once daily for a week and discontinue.  I recommend patient do regular neck stretching exercises and participate in regular activities for stress relaxation like regular exercise, meditation and yoga.  Return for follow-up in 3 months with Camilo Cella nurse practitioner or call earlier if necessary.hthalmology for papilledema.  Return for follow-up in 3 months with nurse practitioner Camilo Cella or call earlier if necessary.  Greater than 50% time during this 35-minute visit was spent in counseling and coordination of care about her resolving cerebral venous sinus thrombosis and residual papilledema and headaches and  answering questions.  Greater than 50% time during this 45-minute visit was spent on counseling and coordination of care and discussion about her chronic daily headaches and paresthesias in send neuro vascular imaging findings and answering questions. Molly Beaver, MD  [facility] [address] [place], [place]  [address]  Phone [phone] Fax [phone] Note: This document was prepared with digital dictation and possible smart phrase technology. Any transcriptional errors that result from this process are unintentional.

## 2024-04-27 ENCOUNTER — Telehealth: Payer: Self-pay

## 2024-04-27 ENCOUNTER — Other Ambulatory Visit (HOSPITAL_COMMUNITY): Payer: Self-pay

## 2024-04-27 NOTE — Telephone Encounter (Signed)
 Pharmacy Patient Advocate Encounter   Received notification from CoverMyMeds that prior authorization for tiZANidine  HCl 2MG  capsules is required/requested.   Insurance verification completed.   The patient is insured through [facility] .   Per test claim: The current 30 day co-pay is, $20.48.  No PA needed at this time. This test claim was processed through [facility]- copay amounts may vary at other pharmacies due to pharmacy/plan contracts, or as the patient moves through the different stages of their insurance plan.     COPAY would be cheaper with the tablets compared to the capsules. See below copay with tablets instead of capsules.

## 2024-05-07 ENCOUNTER — Ambulatory Visit: Payer: No Typology Code available for payment source | Admitting: Hematology and Oncology

## 2024-05-24 ENCOUNTER — Ambulatory Visit: Payer: No Typology Code available for payment source | Admitting: Adult Health

## 2024-05-25 ENCOUNTER — Ambulatory Visit: Admitting: Neurology

## 2024-06-18 ENCOUNTER — Ambulatory Visit: Admitting: Adult Health

## 2024-12-24 ENCOUNTER — Other Ambulatory Visit (HOSPITAL_COMMUNITY): Payer: Self-pay
